# Patient Record
Sex: Female | Born: 1960 | Race: White | Hispanic: No | Marital: Single | Smoking: Current every day smoker
Health system: Southern US, Community
[De-identification: ages and names within clinical notes are randomized; demographics above are authoritative.]

## PROBLEM LIST (undated history)

## (undated) DIAGNOSIS — I639 Cerebral infarction, unspecified: Secondary | ICD-10-CM

## (undated) DIAGNOSIS — J45909 Unspecified asthma, uncomplicated: Secondary | ICD-10-CM

## (undated) DIAGNOSIS — M779 Enthesopathy, unspecified: Secondary | ICD-10-CM

## (undated) DIAGNOSIS — F1490 Cocaine use, unspecified, uncomplicated: Secondary | ICD-10-CM

## (undated) DIAGNOSIS — A4902 Methicillin resistant Staphylococcus aureus infection, unspecified site: Secondary | ICD-10-CM

## (undated) DIAGNOSIS — I10 Essential (primary) hypertension: Secondary | ICD-10-CM

## (undated) DIAGNOSIS — J189 Pneumonia, unspecified organism: Secondary | ICD-10-CM

## (undated) DIAGNOSIS — F411 Generalized anxiety disorder: Secondary | ICD-10-CM

## (undated) DIAGNOSIS — F32A Depression, unspecified: Secondary | ICD-10-CM

## (undated) DIAGNOSIS — F419 Anxiety disorder, unspecified: Secondary | ICD-10-CM

## (undated) HISTORY — PX: ROTATOR CUFF REPAIR: SHX139

## (undated) HISTORY — PX: TUBAL LIGATION: SHX77

---

## 2005-11-18 ENCOUNTER — Ambulatory Visit: Payer: Self-pay | Admitting: Unknown Physician Specialty

## 2007-06-20 ENCOUNTER — Ambulatory Visit: Payer: Self-pay | Admitting: Family Medicine

## 2008-07-23 ENCOUNTER — Ambulatory Visit (HOSPITAL_BASED_OUTPATIENT_CLINIC_OR_DEPARTMENT_OTHER): Admission: RE | Admit: 2008-07-23 | Discharge: 2008-07-23 | Payer: Self-pay | Admitting: Orthopedic Surgery

## 2009-05-21 ENCOUNTER — Emergency Department: Payer: Self-pay | Admitting: Emergency Medicine

## 2010-04-25 ENCOUNTER — Encounter: Payer: Self-pay | Admitting: Physical Medicine and Rehabilitation

## 2010-07-14 LAB — POCT HEMOGLOBIN-HEMACUE: Hemoglobin: 14 g/dL (ref 12.0–15.0)

## 2010-08-17 NOTE — Op Note (Signed)
NAMEGLADIE, GRAVETTE              ACCOUNT NO.:  [id number]   MEDICAL RECORD NO.:  [id number]          PATIENT TYPE:  AMB   LOCATION:  DSC                          FACILITY:  [facility]   PHYSICIAN:  Harvie Junior, M.D.   DATE OF BIRTH:  Sep 16, 1960   DATE OF PROCEDURE:  07/23/2008  DATE OF DISCHARGE:                               OPERATIVE REPORT   PREOPERATIVE DIAGNOSES:  1. Impingement.  2. Acromioclavicular joint arthritis.  3. Possible full thickness rotator cuff tear.   POSTOPERATIVE DIAGNOSES:  1. Impingement.  2. Acromioclavicular joint arthritis.  3. Partial thickness undersurface rotator cuff tear.   PROCEDURES:  1. Arthroscopic subacromial decompression over the lateral and      posterior compartment.  2. Distal clavicle resection to an anterior compartment.  3. Debridement of undersurface, partial thickness rotator cuff tear.   SURGEON:  Harvie Junior, MD   ASSISTANT:  Marshia Ly, PA   ANESTHESIA:  General.   BRIEF HISTORY:  Ms. Dombeck is a 50 year old female with long history of  having a significant left shoulder pain.  We treated her conservatively  for a period of time.  Because of continued complaints of pain, she is  also taken to operating room for operative arthroscopy.  She had been  improved significantly with preoperative injection therapy and she had  an MRI which showed that there was a questionable tear of her  supraspinatus at the leading edge.  Because of failure of all  conservative care, she is now taken to the operating room for this  procedure.   PROCEDURE IN DETAIL:  The patient was taken to operating room.  After  adequate anesthesia was obtained with general anesthetic, the patient  was placed supine on the operating table.  The left shoulder was prepped  and draped in the usual sterile fashion.  Following this, the routine  arthroscopic examination of the shoulder revealed there was an obvious  undersurface rotator cuff tear.  This  rotator cuff tear was debrided  with a suction shaver back to a stable rim.  There was some bleeding  from this undersurface.  We then took an 18-guage spinal and marked this  area from the undersurface with a PDS suture.  Once this mark was made,  we went out of glenohumeral joint and into the subacromial space, and  looked for this mark, found the marked area of the suture and with a  shaver really shaved quite aggressively over this area.  We took the  suture out and really could not find any full thickness area.  We used  the shaver and a probe with a feeler gauge and felt that there was  adequate thickness of rotator cuff at this point.  At this point, we  turned towards taking pressure off of the rotator cuff, so we did an  anterolateral acromioplasty from a lateral and posterior compartment.  We then did a distal clavicle resection of 18 mm to an anterior  compartment and used the ArthroCare wand to really cauterize the end of  the clavicle.  Once I was completed, we took  the shaver and did a  subtotal bursectomy of the subacromial space and then really took a good  long look at the cuff at this point, and really there was no evidence of  partial tearing from the top side.  At this point, the shoulder was  copiously and thoroughly irrigated and suctioned dry.  The left shoulder  portals were closed with the bandage.  Sterile compressive dressing was  applied, and the patient taken to recovery room, she was noted to be in  satisfactory condition.  Estimated blood loss during the procedure was  none.      Harvie Junior, M.D.  Electronically Signed     JLG/MEDQ  D:  07/23/2008  T:  07/24/2008  Job:  [id number]

## 2011-04-04 ENCOUNTER — Emergency Department: Payer: Self-pay | Admitting: Emergency Medicine

## 2011-04-05 LAB — DRUG SCREEN, URINE
Barbiturates, Ur Screen: NEGATIVE (ref ?–200)
Cannabinoid 50 Ng, Ur ~~LOC~~: POSITIVE (ref ?–50)
Cocaine Metabolite,Ur ~~LOC~~: NEGATIVE (ref ?–300)

## 2011-04-06 ENCOUNTER — Emergency Department: Payer: Self-pay | Admitting: Emergency Medicine

## 2011-04-06 LAB — COMPREHENSIVE METABOLIC PANEL
Albumin: 4 g/dL (ref 3.4–5.0)
Alkaline Phosphatase: 53 U/L (ref 50–136)
Bilirubin,Total: 0.9 mg/dL (ref 0.2–1.0)
Chloride: 102 mmol/L (ref 98–107)
Creatinine: 0.59 mg/dL — ABNORMAL LOW (ref 0.60–1.30)
EGFR (African American): >60
EGFR (Non-African Amer.): >60
SGOT(AST): 26 U/L (ref 15–37)
SGPT (ALT): 34 U/L
Total Protein: 7.6 g/dL (ref 6.4–8.2)

## 2011-04-06 LAB — URINALYSIS, COMPLETE
Blood: NEGATIVE
Ph: 6 (ref 4.5–8.0)
Protein: 100
RBC,UR: 1 /HPF (ref 0–5)

## 2011-04-06 LAB — CBC
HGB: 15.7 g/dL (ref 12.0–16.0)
MCH: 32.5 pg (ref 26.0–34.0)
MCHC: 35.2 g/dL (ref 32.0–36.0)
Platelet: 274 10*3/uL (ref 150–440)
RDW: 11.2 % — ABNORMAL LOW (ref 11.5–14.5)

## 2011-04-06 LAB — LIPASE, BLOOD: Lipase: 128 U/L (ref 73–393)

## 2011-04-08 ENCOUNTER — Inpatient Hospital Stay: Payer: Self-pay | Admitting: Internal Medicine

## 2011-04-08 LAB — COMPREHENSIVE METABOLIC PANEL
Alkaline Phosphatase: 55 U/L (ref 50–136)
Anion Gap: 11 (ref 7–16)
Calcium, Total: 9 mg/dL (ref 8.5–10.1)
Chloride: 98 mmol/L (ref 98–107)
Co2: 29 mmol/L (ref 21–32)
Creatinine: 0.77 mg/dL (ref 0.60–1.30)
EGFR (African American): >60
EGFR (Non-African Amer.): >60
Osmolality: 277 (ref 275–301)

## 2011-04-08 LAB — MAGNESIUM: Magnesium: 1.7 mg/dL — ABNORMAL LOW

## 2011-04-08 LAB — CBC
HCT: 51.1 % — ABNORMAL HIGH (ref 35.0–47.0)
HGB: 17.4 g/dL — ABNORMAL HIGH (ref 12.0–16.0)
MCHC: 34.1 g/dL (ref 32.0–36.0)
MCV: 93 fL (ref 80–100)

## 2011-04-08 LAB — TROPONIN I: Troponin-I: 0.27 ng/mL — ABNORMAL HIGH

## 2011-04-08 LAB — CK TOTAL AND CKMB (NOT AT ARMC)
CK, Total: 49 U/L
CK-MB: 2.8 ng/mL

## 2011-04-08 LAB — LIPASE, BLOOD: Lipase: 88 U/L (ref 73–393)

## 2011-04-09 LAB — URINALYSIS, COMPLETE
Bilirubin,UR: NEGATIVE
Glucose,UR: NEGATIVE mg/dL (ref 0–75)
Leukocyte Esterase: NEGATIVE
Nitrite: NEGATIVE
Protein: 30
RBC,UR: 7 /HPF (ref 0–5)
Specific Gravity: 1.02 (ref 1.003–1.030)
WBC UR: 9 /HPF (ref 0–5)

## 2011-04-09 LAB — CBC WITH DIFFERENTIAL/PLATELET
Basophil %: 0.2 %
Eosinophil #: 0.1 10*3/uL (ref 0.0–0.7)
Eosinophil %: 0.7 %
HCT: 43.7 % (ref 35.0–47.0)
HGB: 14.8 g/dL (ref 12.0–16.0)
MCH: 31.6 pg (ref 26.0–34.0)
MCV: 93 fL (ref 80–100)
Monocyte #: 1.1 10*3/uL — ABNORMAL HIGH (ref 0.0–0.7)
Neutrophil #: 9.6 10*3/uL — ABNORMAL HIGH (ref 1.4–6.5)
RBC: 4.69 10*6/uL (ref 3.80–5.20)
WBC: 13.9 10*3/uL — ABNORMAL HIGH (ref 3.6–11.0)

## 2011-04-09 LAB — CK TOTAL AND CKMB (NOT AT ARMC)
CK, Total: 40 U/L (ref 21–215)
CK, Total: 40 U/L (ref 21–215)
CK-MB: 2.3 ng/mL (ref 0.5–3.6)

## 2011-04-09 LAB — BASIC METABOLIC PANEL
Anion Gap: 9 (ref 7–16)
BUN: 12 mg/dL (ref 7–18)
Chloride: 103 mmol/L (ref 98–107)
Creatinine: 0.58 mg/dL — ABNORMAL LOW (ref 0.60–1.30)
Glucose: 108 mg/dL — ABNORMAL HIGH (ref 65–99)
Osmolality: 276 (ref 275–301)
Potassium: 3.6 mmol/L (ref 3.5–5.1)
Sodium: 138 mmol/L (ref 136–145)

## 2011-04-09 LAB — TROPONIN I
Troponin-I: 0.21 ng/mL — ABNORMAL HIGH
Troponin-I: 0.14 ng/mL — ABNORMAL HIGH

## 2011-04-09 LAB — TSH: Thyroid Stimulating Horm: 1.16 u[IU]/mL

## 2011-04-09 LAB — LIPID PANEL
Cholesterol: 169 mg/dL (ref 0–200)
Triglycerides: 118 mg/dL (ref 0–200)
VLDL Cholesterol, Calc: 24 mg/dL (ref 5–40)

## 2011-04-10 LAB — CBC WITH DIFFERENTIAL/PLATELET
Basophil #: 0 10*3/uL (ref 0.0–0.1)
Eosinophil #: 0.1 10*3/uL (ref 0.0–0.7)
Eosinophil %: 1.3 %
HCT: 35.2 % (ref 35.0–47.0)
Lymphocyte #: 3.3 10*3/uL (ref 1.0–3.6)
MCH: 31.8 pg (ref 26.0–34.0)
MCHC: 33.9 g/dL (ref 32.0–36.0)
MCV: 94 fL (ref 80–100)
Monocyte #: 0.8 10*3/uL — ABNORMAL HIGH (ref 0.0–0.7)
Platelet: 201 10*3/uL (ref 150–440)
RBC: 3.76 10*6/uL — ABNORMAL LOW (ref 3.80–5.20)
RDW: 13 % (ref 11.5–14.5)

## 2011-04-11 LAB — BASIC METABOLIC PANEL
Anion Gap: 9 (ref 7–16)
BUN: 8 mg/dL (ref 7–18)
Chloride: 108 mmol/L — ABNORMAL HIGH (ref 98–107)
Creatinine: 0.55 mg/dL — ABNORMAL LOW (ref 0.60–1.30)
EGFR (Non-African Amer.): >60
Glucose: 76 mg/dL (ref 65–99)
Osmolality: 282 (ref 275–301)
Potassium: 3.8 mmol/L (ref 3.5–5.1)

## 2012-04-23 ENCOUNTER — Emergency Department: Payer: Self-pay | Admitting: Unknown Physician Specialty

## 2012-12-13 ENCOUNTER — Emergency Department: Payer: Self-pay | Admitting: Emergency Medicine

## 2014-07-27 NOTE — Consult Note (Signed)
Brief Consult Note: Diagnosis: chest pain with atypical and typcial features.   Patient was seen by consultant.   Consult note dictated.   Recommend further assessment or treatment.   Orders entered.   Comments: 54 yo female who has been seen in the er several ties in the past week with compalints of chest pain. Pain is exertional but also occurs at rest or with deep breathing and deep palpation. Mild troponin elevation Chest ct unremarkable. Will need cardiac cath to evaluate anatomy to guiide further therapy in patient with chest pain, risk factors for cad and abnormal troponin. Left cardiac cath on Jan 7 with further recs after cath.  Electronic Signatures: Dalia HeadingFath, Yecenia Dalgleish A (MD)  (Signed 05-Jan-13 22:44)  Authored: Brief Consult Note   Last Updated: 05-Jan-13 22:44 by Dalia HeadingFath, Augustine Brannick A (MD)

## 2014-07-27 NOTE — Discharge Summary (Signed)
PATIENT NAME:  Molly Greer, Molly Greer MR#:  [id number] DATE OF BIRTH:  September 04, 1960  DATE OF ADMISSION:  04/08/2011 DATE OF DISCHARGE:  04/12/2011  ADMITTING PHYSICIAN: Dr. Camillo Flaming  DISCHARGING PHYSICIAN: Dr. Enid Baas  PRIMARY CARE PHYSICIAN: Dr. Esmond Plants IN THE HOSPITAL: Cardiology consultation by Dr. Harold Hedge.   DISCHARGE DIAGNOSES:  1. Chest pain secondary to costochondritis.  2. Elevated troponin, probably mild myocarditis on presentation but cardiac catheterization negative for any coronary artery disease.  3. Anxiety.  4. Inflammatory right inguinal and right pubic nodes with flares.  5. Tobacco use disorder.   DISCHARGE HOME MEDICATIONS:  1. Celexa 20 mg p.o. daily.  2. Xanax 0.5 p.o. b.i.d. p.r.n. This is patient's prior to admission medication.  3. Motrin 200 to 400 mg as needed every eight hours for pain.   DISCHARGE DIET: Regular diet.   DISCHARGE ACTIVITY: As tolerated.    FOLLOW UP INSTRUCTIONS:  1. Follow up with PCP in one week.  2. Follow up with surgery as needed if inguinal lymphadenopathy flares up and to evaluate need for biopsy.   LABORATORY, DIAGNOSTIC AND RADIOLOGICAL DATA: Labs at the time of discharge: Sodium 142, potassium 3.8, chloride 108, bicarbonate 26, BUN 8, creatinine 0.55, glucose 76, calcium 7.8.   WBC 9.3, hemoglobin 11.9, hematocrit 35.2, platelet count 201.   Echo Doppler showing mildly reduced left ventricular systolic function, ejection fraction of 45% to 50%. Left atrial size is normal. No pericardial effusion was noted.   Troponin was elevated at 0.27 on admission and came down to 0.14. Urinalysis negative for any infection but did have protein of almost 100 mg/dL.   ALT 34, AST 26, alkaline phosphatase 53, total bilirubin 0.9, albumin 4.0, lipase 128.   Chest x-ray showing blunting of right costophrenic angle secondary to pleural fibrosis rather than pleural effusion and possibly has underlying chronic  obstructive pulmonary disease.   LDL cholesterol 104, HDL 41, total cholesterol 045, triglycerides 118.   CT of the chest with contrast showing no PE. No thoracic aortic aneurysm. Mild ground-glass attenuation right lower lobe. Follow up in three months recommended. Areas of linear subsegmental atelectasis versus fibrosis is present.   CT of the abdomen and pelvis showing no acute changes in abdomen and pelvis. Appendix is not definitely identified. No inflammatory changes see in right lower quadrant. Small indeterminate lesion in left hepatic lobe is present.   BRIEF HOSPITAL COURSE: [name] is a 54 year old Caucasian female with past medical history significant for anxiety and tobacco use disorder who has been to the Emergency Room already twice prior to admission secondary to nausea, vomiting, chest and abdominal pain. She had a CT of the abdomen with results as above without any significant change so was discharged home, comes back with chest pain, similar symptoms with a slightly elevated troponin of 0.27.  1. Chest pain. Initially thought to be NSTEMI with slightly elevated troponin versus myocarditis because of the atypical nature of the pain with tenderness present. Evaluated by Dr. Lady Gary who felt to rule out coronary artery disease by catheterization so she had a cardiac catheterization done on 04/11/2011 and the results of the catheterization show that she did not have any significant atherosclerotic coronary artery disease. Her pain was being relieved with Percocet and Motrin while in the hospital and she is being discharged on Motrin.  2. Generalized anxiety with underlying depression. Patient has a lot of emotional issues at home because of her daughter. She has been tearful at times  while discussing the above matter. She was using Xanax lately at home p.r.n. for anxiety. Also started on low-dose Celexa while in the hospital.  3. Right inguinal lymph node. She does have bilateral inguinal  lymph nodes and also a small soft tissue mass felt in the right labia majora on day two of hospitalization but that disappeared. These nodes were tender and then they disappeared the next day. Patient says she has been has been noticing these nodes with flares and disappearing over the last three months so advised patient to see a surgeon whenever they flare up for possible biopsy if she is really concerned about it.   4. Her course has been otherwise uneventful.   DISCHARGE CONDITION: Stable.   DISCHARGE DISPOSITION: Home.   TIME SPENT ON DISCHARGE: 45 minutes.   ____________________________ Enid Baasadhika Harlowe Dowler, MD rk:cms D: 04/12/2011 17:03:29 ET T: 04/14/2011 10:09:19 ET JOB#: [id number]  cc: Enid Baasadhika Pamla Pangle, MD, <Dictator> Meindert A. Lacie ScottsNiemeyer, MD Darlin PriestlyKenneth A. Lady GaryFath, MD Adah Salvageichard E. Excell Seltzerooper, MD Enid BaasADHIKA Briyan Kleven MD ELECTRONICALLY SIGNED 04/18/2011 11:57

## 2014-07-27 NOTE — H&P (Signed)
PATIENT NAME:  Molly Greer, Kayley MR#:  [id number] DATE OF BIRTH:  [birth year]  DATE OF ADMISSION:  04/08/2011  REFERRING PHYSICIAN: Dr. Enedina FinnerGoli  PRIMARY CARE PHYSICIAN: Dr. Lacie ScottsNiemeyer   CHIEF COMPLAINT: "I'm having spasms in my chest."   HISTORY OF PRESENT ILLNESS: Patient is a 54 year old female with past medical history of anxiety and tobacco abuse who presents to the Emergency Department with the above-mentioned chief complaint. Patient states she came to the ER twice this week. She came Monday as she had nausea and several episodes of vomiting associated with some abdominal discomfort. Some labs were obtained in the ER and she underwent a CT of the abdomen which was unremarkable and she was given a bag of IV fluids and sent home on analgesics and antiemetics. This was Monday. Tuesday she stated that she laid in bed all day. She returned to the ER on Wednesday with persistent nausea and vomiting and she states she also had some chest discomfort at that time. She states that she was seen and evaluated and was given medications for gastroesophageal reflux disease and nausea as well as for pain and was once again sent home. She has not felt any better. Her nausea medications have almost ran out. She returned today with severe chest pain 10/10 described it as a stabbing sensation in the center of her chest which is radiating to her left arm, not improved with applying a heating pad and associated with mild shortness of breath. Chest pain radiated to the left arm. She did not have any nausea or vomiting today. She did not have any diaphoresis. Her chest pain lasted for about 15 minutes and when she notified her fiance she tried to relax and laid in the fetal position and her chest pain is somewhat improved. Thereafter, fiance brought her to the hospital to the ER and she redeveloped chest pain and spasming in her chest. EKG was obtained revealing sinus rhythm with left anterior hemiblock and nonspecific ST and T wave  changes in addition to QT prolongation. Troponin was checked and found to be elevated at 0.27. There are no prior EKGs for comparison in our system and troponin was not checked earlier in the week. She was also noted to have slightly elevated white count and also elevated hemoglobin and hematocrit and she states that she has had decreased p.o. intake over the past five days. She also had a urinalysis earlier in the week which was noted to have some ketones. Otherwise, at this time she is without specific complaints. Her chest pain has improved from 10/10 to 8/10 after she was given a dose of aspirin and metoprolol and Ativan in the ER. Otherwise, she is without specific complaints at this time. Hospitalist services were contacted for further evaluation.   PAST SURGICAL HISTORY: Left rotator cuff surgery.   PAST MEDICAL HISTORY:  1. Anxiety.  2. Tobacco abuse.   ALLERGIES: Penicillin.   HOME MEDICATIONS:  1. Xanax 0.5 mg p.o. t.i.d.-q.i.d. p.r.n.  2. Vicodin p.r.n. over the past week, she was given a prescription in our ER.  3. Antiemetic, unknown name which has ran out.   FAMILY HISTORY: Significant for coronary artery disease and myocardial infarction. Mother died at age 53 this past May secondary to myocardial infarction. Father died at age 53 secondary to myocardial infarction. She had a grandfather who died of a massive heart attack as well, an uncle and aunt with MI. Her grandmother had breast cancer.   SOCIAL HISTORY: Tobacco-States she has not  smoked over the past five days but normally smokes less than half pack per day, has been smoking on and off for approximately 30 years. Alcohol-Occasional. Illicit drugs-None. Patient comes accompanied by her fiancee today.   REVIEW OF SYSTEMS: CONSTITUTIONAL: Reports chest pain, shortness of breath and generalized weakness. Denies fevers, chills, or recent changes in weight. HEAD/EYES: Denies headache or blurry vision. ENT: Denies tinnitus, earache,  nasal discharge, or sore throat. RESPIRATORY: Has some shortness of breath. Denies cough or wheeziness. CARDIOVASCULAR: Reports chest pain and heart palpitations. Denies orthopnea or lower extremity edema. GASTROINTESTINAL: Had nausea and vomiting earlier in the week, none today. Denies abdominal pain, melena, hematochezia or diarrhea or constipation. GENITOURINARY: She denies dysuria or hematuria. ENDOCRINE: She denies heat or cold intolerance or history of thyroid abnormalities. HEME/LYMPH: Denies easy bruising or bleeding. INTEGUMENTARY: Denies rash. MUSCULOSKELETAL: Has pain in her chest. Denies back pain. Has generalized muscle weakness. NEUROLOGIC: Has generalized weakness. Denies headache. She had mild numbness and tingling of the left arm associated with her chest pain. Denies dysarthria. PSYCHIATRIC: Has underlying anxiety. Denies depression but has been in bereavement and grieving process secondary to the recent death of her mother. She denies suicidal or homicidal ideation.    PHYSICAL EXAMINATION:  VITAL SIGNS: Temperature 98.6, pulse 87, blood pressure 132/86, respirations 20, oxygen saturation 100% on room air.   GENERAL: Moderately anxious-appearing female. She is alert and oriented, in mild distress secondary to pain.   HEENT: Normocephalic, atraumatic. Pupils are equal, round and reactive to light accommodation. Extraocular muscles are intact. Anicteric sclerae. Conjunctivae pink. Hearing intact to voice. Nares without drainage. Oral mucous is dry but without any lesions noted.   NECK: Supple with full range of motion. No JVD, lymphadenopathy, or carotid bruits bilaterally. No thyromegaly or tenderness to palpation over thyroid gland.    CHEST: Normal respiratory effort without use of accessory respiratory muscles. Lungs are clear to auscultation bilaterally without crackles or rales or wheezing.   CARDIOVASCULAR: S1, S2 positive, regular rate and rhythm. No murmurs, rubs, or gallops.  PMI is non-lateralized.   ABDOMEN: Soft, nontender, nondistended. Normoactive bowel sounds. No hepatosplenomegaly or palpable masses. No hernias.   EXTREMITIES: No clubbing, cyanosis, or edema. Pedal pulses are palpable bilaterally.   SKIN: No suspicious rashes.   LYMPH: No cervical lymphadenopathy.   NEUROLOGICAL: Alert and oriented x3. Cranial nerves II through XII are grossly intact. No focal deficits.   PSYCH: Anxious-appearing female.   LABORATORY, DIAGNOSTIC AND RADIOLOGICAL DATA:  Chest x-ray PA and lateral: There is blunting of the right costosternal angle. No definite effusion is seen. Correlate for pleural fibrosis rather than pleural effusion as the posterior gutter appears unremarkable. Lungs show normal aeration otherwise. Cardiac silhouette is normal. No underlying mass, infiltrate or pneumothorax is noted. There is probable chronic obstructive pulmonary disease.   CBC: WBC 13.2, hemoglobin 17.4, hematocrit 51.1, platelets 299, MCV 93.   Complete metabolic panel within normal limits except for serum potassium of 3.3.   Lipase 88.   Troponin 0.27.   EKG: Normal sinus rhythm, 90 beats per minute with left anterior hemiblock. There is RSR pattern in V1 suggestive of possible right ventricular conduction delay. There is nonspecific ST and T wave changes. There is QT prolongation at 516 ms. No prior EKGs for comparison in our system.   ASSESSMENT AND PLAN: 54 year old female with past medical history of anxiety and tobacco abuse as well as strong family history of coronary artery disease and myocardial infarction who presents to  the Emergency Department with complaints of chest pain and shortness of breath, found to have abnormal EKG with elevated troponin with:  1. Non-STEMI. Will admit patient to telemetry unit. Continue to cycle her cardiac enzymes. She may have been experiencing an myocardial infarction earlier in the week as she had nausea and vomiting as well as abdominal  pain but no EKG or cardiac enzymes were checked earlier in the week. Her troponin is now elevated and could have trended down from earlier this week. Will continue to cycle her cardiac enzymes. For now will keep her on oxygen, aspirin, Lovenox, nitrate and beta blocker therapy (low dose) and also provide pain control until she is chest pain free. Will obtain 2-D echocardiogram and cardiology consultation for further recommendations. Further work-up and management to follow depending on patient's clinical course. Patient also noted to have abnormal EKG with left anterior hemiblock and nonspecific ST and T wave changes as well as prolonged QT for which we will also check a magnesium level. There is also suggestion of possible right ventricular conduction delay which could be secondary to pulmonary issue such as chronic obstructive pulmonary disease and she may need further workup and evaluation which can likely be performed as an outpatient but will defer this to the primary team.  2. Leukocytosis/erythrocytosis. Suspect this could be due to hemoconcentration. Patient has not eating adequately for two days secondary to nausea and vomiting and feeling generally ill. She also had ketones in the urine further suggestive of hemoconcentration and possible volume depletion. Will see if her WBC and hemoglobin and hematocrit improve with hydration and if not will pursue further work-up. Additionally, she may have erythrocytosis from chronic obstructive pulmonary disease and smoking history and chest x-ray was also suggestive of possible chronic obstructive pulmonary disease and she may need referral to pulmonology as an outpatient but will defer this to primary team. Otherwise, her WBC count could also be stress induced and she is currently afebrile therefore, will hold off on antibiotic therapy at this time and will follow up CBC post hydration. 3. Hypokalemia likely due to recurrent vomiting. Will replace with potassium  chloride and recheck level in a.m.  4. Anxiety. Continue p.r.n. Xanax.  5. Tobacco abuse. Patient was strongly counseled on the importance of smoking cessation, approximately three minutes were spent in doing so. Patient declined nicotine patch at this time.  6. Deep vein thrombosis prophylaxis with Lovenox.  7. CODE STATUS: FULL CODE.   TIME SPENT ON THIS ADMISSION: Approximately 50 minutes.   ____________________________ Elon Alas, MD knl:cms D: 04/08/2011 22:39:00 ET T: 04/09/2011 08:13:12 ET JOB#: [id number]  cc: Elon Alas, MD, <Dictator> Meindert A. Lacie Scotts, MD Elon Alas MD ELECTRONICALLY SIGNED 04/21/2011 18:56

## 2014-08-28 ENCOUNTER — Encounter: Payer: Self-pay | Admitting: Podiatry

## 2014-08-28 ENCOUNTER — Ambulatory Visit (INDEPENDENT_AMBULATORY_CARE_PROVIDER_SITE_OTHER): Payer: Self-pay | Admitting: Podiatry

## 2014-08-28 ENCOUNTER — Ambulatory Visit (INDEPENDENT_AMBULATORY_CARE_PROVIDER_SITE_OTHER): Payer: Self-pay

## 2014-08-28 VITALS — BP 126/83 | HR 91 | Resp 16 | Ht 62.0 in | Wt 120.0 lb

## 2014-08-28 DIAGNOSIS — M722 Plantar fascial fibromatosis: Secondary | ICD-10-CM

## 2014-08-28 MED ORDER — TRIAMCINOLONE ACETONIDE 10 MG/ML IJ SUSP
10.0000 mg | Freq: Once | INTRAMUSCULAR | Status: AC
  Administered 2014-08-28: 10 mg

## 2014-08-28 NOTE — Patient Instructions (Signed)
Plantar Fasciitis (Heel Spur Syndrome) with Rehab The plantar fascia is a fibrous, ligament-like, soft-tissue structure that spans the bottom of the foot. Plantar fasciitis is a condition that causes pain in the foot due to inflammation of the tissue. SYMPTOMS   Pain and tenderness on the underneath side of the foot.  Pain that worsens with standing or walking. CAUSES  Plantar fasciitis is caused by irritation and injury to the plantar fascia on the underneath side of the foot. Common mechanisms of injury include:  Direct trauma to bottom of the foot.  Damage to a small nerve that runs under the foot where the main fascia attaches to the heel bone.  Stress placed on the plantar fascia due to bone spurs. RISK INCREASES WITH:   Activities that place stress on the plantar fascia (running, jumping, pivoting, or cutting).  Poor strength and flexibility.  Improperly fitted shoes.  Tight calf muscles.  Flat feet.  Failure to warm-up properly before activity.  Obesity. PREVENTION  Warm up and stretch properly before activity.  Allow for adequate recovery between workouts.  Maintain physical fitness:  Strength, flexibility, and endurance.  Cardiovascular fitness.  Maintain a health body weight.  Avoid stress on the plantar fascia.  Wear properly fitted shoes, including arch supports for individuals who have flat feet. PROGNOSIS  If treated properly, then the symptoms of plantar fasciitis usually resolve without surgery. However, occasionally surgery is necessary. RELATED COMPLICATIONS   Recurrent symptoms that may result in a chronic condition.  Problems of the lower back that are caused by compensating for the injury, such as limping.  Pain or weakness of the foot during push-off following surgery.  Chronic inflammation, scarring, and partial or complete fascia tear, occurring more often from repeated injections. TREATMENT  Treatment initially involves the use of  ice and medication to help reduce pain and inflammation. The use of strengthening and stretching exercises may help reduce pain with activity, especially stretches of the Achilles tendon. These exercises may be performed at home or with a therapist. Your caregiver may recommend that you use heel cups of arch supports to help reduce stress on the plantar fascia. Occasionally, corticosteroid injections are given to reduce inflammation. If symptoms persist for greater than 6 months despite non-surgical (conservative), then surgery may be recommended.  MEDICATION   If pain medication is necessary, then nonsteroidal anti-inflammatory medications, such as aspirin and ibuprofen, or other minor pain relievers, such as acetaminophen, are often recommended.  Do not take pain medication within 7 days before surgery.  Prescription pain relievers may be given if deemed necessary by your caregiver. Use only as directed and only as much as you need.  Corticosteroid injections may be given by your caregiver. These injections should be reserved for the most serious cases, because they may only be given a certain number of times. HEAT AND COLD  Cold treatment (icing) relieves pain and reduces inflammation. Cold treatment should be applied for 10 to 15 minutes every 2 to 3 hours for inflammation and pain and immediately after any activity that aggravates your symptoms. Use ice packs or massage the area with a piece of ice (ice massage).  Heat treatment may be used prior to performing the stretching and strengthening activities prescribed by your caregiver, physical therapist, or athletic trainer. Use a heat pack or soak the injury in warm water. SEEK IMMEDIATE MEDICAL CARE IF:  Treatment seems to offer no benefit, or the condition worsens.  Any medications produce adverse side effects. EXERCISES RANGE   OF MOTION (ROM) AND STRETCHING EXERCISES - Plantar Fasciitis (Heel Spur Syndrome) These exercises may help you  when beginning to rehabilitate your injury. Your symptoms may resolve with or without further involvement from your physician, physical therapist or athletic trainer. While completing these exercises, remember:   Restoring tissue flexibility helps normal motion to return to the joints. This allows healthier, less painful movement and activity.  An effective stretch should be held for at least 30 seconds.  A stretch should never be painful. You should only feel a gentle lengthening or release in the stretched tissue. RANGE OF MOTION - Toe Extension, Flexion  Sit with your right / left leg crossed over your opposite knee.  Grasp your toes and gently pull them back toward the top of your foot. You should feel a stretch on the bottom of your toes and/or foot.  Hold this stretch for __________ seconds.  Now, gently pull your toes toward the bottom of your foot. You should feel a stretch on the top of your toes and or foot.  Hold this stretch for __________ seconds. Repeat __________ times. Complete this stretch __________ times per day.  RANGE OF MOTION - Ankle Dorsiflexion, Active Assisted  Remove shoes and sit on a chair that is preferably not on a carpeted surface.  Place right / left foot under knee. Extend your opposite leg for support.  Keeping your heel down, slide your right / left foot back toward the chair until you feel a stretch at your ankle or calf. If you do not feel a stretch, slide your bottom forward to the edge of the chair, while still keeping your heel down.  Hold this stretch for __________ seconds. Repeat __________ times. Complete this stretch __________ times per day.  STRETCH - Gastroc, Standing  Place hands on wall.  Extend right / left leg, keeping the front knee somewhat bent.  Slightly point your toes inward on your back foot.  Keeping your right / left heel on the floor and your knee straight, shift your weight toward the wall, not allowing your back to  arch.  You should feel a gentle stretch in the right / left calf. Hold this position for __________ seconds. Repeat __________ times. Complete this stretch __________ times per day. STRETCH - Soleus, Standing  Place hands on wall.  Extend right / left leg, keeping the other knee somewhat bent.  Slightly point your toes inward on your back foot.  Keep your right / left heel on the floor, bend your back knee, and slightly shift your weight over the back leg so that you feel a gentle stretch deep in your back calf.  Hold this position for __________ seconds. Repeat __________ times. Complete this stretch __________ times per day. STRETCH - Gastrocsoleus, Standing  Note: This exercise can place a lot of stress on your foot and ankle. Please complete this exercise only if specifically instructed by your caregiver.   Place the ball of your right / left foot on a step, keeping your other foot firmly on the same step.  Hold on to the wall or a rail for balance.  Slowly lift your other foot, allowing your body weight to press your heel down over the edge of the step.  You should feel a stretch in your right / left calf.  Hold this position for __________ seconds.  Repeat this exercise with a slight bend in your right / left knee. Repeat __________ times. Complete this stretch __________ times per day.    STRENGTHENING EXERCISES - Plantar Fasciitis (Heel Spur Syndrome)  These exercises may help you when beginning to rehabilitate your injury. They may resolve your symptoms with or without further involvement from your physician, physical therapist or athletic trainer. While completing these exercises, remember:   Muscles can gain both the endurance and the strength needed for everyday activities through controlled exercises.  Complete these exercises as instructed by your physician, physical therapist or athletic trainer. Progress the resistance and repetitions only as guided. STRENGTH -  Towel Curls  Sit in a chair positioned on a non-carpeted surface.  Place your foot on a towel, keeping your heel on the floor.  Pull the towel toward your heel by only curling your toes. Keep your heel on the floor.  If instructed by your physician, physical therapist or athletic trainer, add ____________________ at the end of the towel. Repeat __________ times. Complete this exercise __________ times per day. STRENGTH - Ankle Inversion  Secure one end of a rubber exercise band/tubing to a fixed object (table, pole). Loop the other end around your foot just before your toes.  Place your fists between your knees. This will focus your strengthening at your ankle.  Slowly, pull your big toe up and in, making sure the band/tubing is positioned to resist the entire motion.  Hold this position for __________ seconds.  Have your muscles resist the band/tubing as it slowly pulls your foot back to the starting position. Repeat __________ times. Complete this exercises __________ times per day.  Document Released: 03/21/2005 Document Revised: 06/13/2011 Document Reviewed: 07/03/2008 [employer] Patient Information 2015 [employer]. This information is not intended to replace advice given to you by your health care provider. Make sure you discuss any questions you have with your health care provider.  

## 2014-09-01 NOTE — Progress Notes (Signed)
Subjective:     Patient ID: Molly Greer, female   DOB: [birth year], 54 y.o.   MRN: [id number]  HPI 54 year old female presents the office they with complaints of right heel pain which been ongoing for proximally 2 months. She states that she has pain mostly in the morning or after periods of inactivity which is relieved by ambulation. She states that she is on her feet quite some time as she works double shifts. She's been applying ice to the area which she was to help some. She also states that she possibly has early rheumatoid arthritis however she has not been officially diagnosed yet. She denies any numbness or tingling. Denies any swelling or redness. Denies any history of injury or trauma or any change or increase in activity the time of onset of symptoms. No other complaints at this time.  Review of Systems  All other systems reviewed and are negative.      Objective:   Physical Exam AAO x3, NAD DP/PT pulses palpable bilaterally, CRT less than 3 seconds Protective sensation intact with Simms Weinstein monofilament, vibratory sensation intact, Achilles tendon reflex intact Tenderness to palpation overlying the plantar medial tubercle of the calcaneus to the right heel at the insertion of the plantar fascia. There is no pain along the course of plantar fascia within the arch of the foot. The plantar fascia appears intact. There is no pain with lateral compression of the calcaneus or pain the vibratory sensation. No pain on the posterior aspect of the calcaneus or along the course/insertion of the Achilles tendon. There is no overlying edema, erythema, increase in warmth. No other areas of tenderness palpation or pain with vibratory sensation to the foot/ankle. MMT 5/5, ROM WNL No open lesions or pre-ulcerative lesions are identified. No pain with calf compression, swelling, warmth, erythema.     Assessment:     54 year old female right heel pain, likely plantar fasciitis    Plan:      -X-rays were obtained and reviewed with the patient.  -Treatment options discussed including all alternatives, risks, and complications Patient elects to proceed with steroid injection into the right heel. Under sterile skin preparation, a total of 2.5cc of kenalog 10, 0.5% Marcaine plain, and 2% lidocaine plain were infiltrated into the symptomatic area without complication. A band-aid was applied. Patient tolerated the injection well without complication. Post-injection care with discussed with the patient. Discussed with the patient to ice the area over the next couple of days to help prevent a steroid flare.  -Plantar fascial taping was applied. -Ice to the area daily -Discussed stretching exercises. -She started taking ibuprofen 800 mg can continue this. -Discussed shoe gear modifications and possible orthotics. Recommended to wear supportive shoe all the time not to go barefoot even while at home. -Follow-up in 3 weeks or sooner if any problems are to arise. In the meantime, call the office with any questions, concerns, change in symptoms.

## 2014-09-18 ENCOUNTER — Ambulatory Visit: Payer: Self-pay | Admitting: Podiatry

## 2015-10-25 ENCOUNTER — Emergency Department
Admission: EM | Admit: 2015-10-25 | Discharge: 2015-10-25 | Disposition: A | Discharge status: Home / Self Care | Payer: Self-pay | Attending: Emergency Medicine | Admitting: Emergency Medicine

## 2015-10-25 ENCOUNTER — Encounter: Payer: Self-pay | Admitting: Emergency Medicine

## 2015-10-25 DIAGNOSIS — F17210 Nicotine dependence, cigarettes, uncomplicated: Secondary | ICD-10-CM | POA: Insufficient documentation

## 2015-10-25 DIAGNOSIS — B029 Zoster without complications: Secondary | ICD-10-CM | POA: Insufficient documentation

## 2015-10-25 HISTORY — DX: Anxiety disorder, unspecified: F41.9

## 2015-10-25 HISTORY — DX: Enthesopathy, unspecified: M77.9

## 2015-10-25 LAB — CBC WITH DIFFERENTIAL/PLATELET
BASOS PCT: 1 %
Basophils Absolute: 0.1 10*3/uL (ref 0–0.1)
EOS ABS: 0.2 10*3/uL (ref 0–0.7)
Eosinophils Relative: 3 %
HCT: 39.5 % (ref 35.0–47.0)
Hemoglobin: 14 g/dL (ref 12.0–16.0)
Lymphocytes Relative: 32 %
Lymphs Abs: 2.2 10*3/uL (ref 1.0–3.6)
MCH: 32.3 pg (ref 26.0–34.0)
MCHC: 35.4 g/dL (ref 32.0–36.0)
MCV: 91.3 fL (ref 80.0–100.0)
MONO ABS: 0.6 10*3/uL (ref 0.2–0.9)
MONOS PCT: 9 %
NEUTROS PCT: 55 %
Neutro Abs: 3.9 10*3/uL (ref 1.4–6.5)
Platelets: 217 10*3/uL (ref 150–440)
RBC: 4.33 MIL/uL (ref 3.80–5.20)
RDW: 13.2 % (ref 11.5–14.5)
WBC: 6.9 10*3/uL (ref 3.6–11.0)

## 2015-10-25 LAB — BASIC METABOLIC PANEL
Anion gap: 5 (ref 5–15)
BUN: 18 mg/dL (ref 6–20)
CALCIUM: 8.9 mg/dL (ref 8.9–10.3)
CO2: 25 mmol/L (ref 22–32)
Chloride: 110 mmol/L (ref 101–111)
Creatinine, Ser: 0.56 mg/dL (ref 0.44–1.00)
GFR calc Af Amer: >60 mL/min (ref >60)
GLUCOSE: 91 mg/dL (ref 65–99)
Potassium: 3.8 mmol/L (ref 3.5–5.1)
Sodium: 140 mmol/L (ref 135–145)

## 2015-10-25 MED ORDER — ACYCLOVIR 400 MG PO TABS: 400.0000 mg | ORAL_TABLET | Freq: Every day | ORAL | 0 refills | Status: AC

## 2015-10-25 MED ORDER — OXYCODONE-ACETAMINOPHEN 5-325 MG PO TABS: 1.0000 | ORAL_TABLET | ORAL | 0 refills | Status: DC | PRN

## 2015-10-25 MED ORDER — GABAPENTIN 100 MG PO CAPS: 100.0000 mg | ORAL_CAPSULE | Freq: Three times a day (TID) | ORAL | 0 refills | Status: DC

## 2015-10-25 MED ORDER — BACITRACIN-NEOMYCIN-POLYMYXIN OINTMENT TUBE
TOPICAL_OINTMENT | Freq: Once | CUTANEOUS | Status: AC
  Administered 2015-10-25: 1 via TOPICAL

## 2015-10-25 MED ORDER — KETOROLAC TROMETHAMINE 60 MG/2ML IM SOLN
60.0000 mg | Freq: Once | INTRAMUSCULAR | Status: AC
  Administered 2015-10-25: 60 mg via INTRAMUSCULAR
  Filled 2015-10-25: qty 2

## 2015-10-25 NOTE — ED Triage Notes (Signed)
Pt states she noticed rash to mid back on both sides of back.  Pt also states she has pain to her left side extending up her back.  Pt has been using bactroban and bandaids to back to cover rash. Pt states her daughter told her it was Shingles.

## 2015-10-25 NOTE — ED Notes (Signed)
Used non-stick dressing to apply to middle back wounds and secured with tape.  Neosporin applied prior to dressings applied.

## 2015-10-25 NOTE — ED Triage Notes (Signed)
Pt states she took 800mg  of Ibuprofen around 1000 today

## 2015-10-25 NOTE — ED Notes (Signed)

## 2015-10-25 NOTE — ED Provider Notes (Signed)
[facility] Emergency Department Provider Note   ____________________________________________  Time seen: Approximately 11:49 AM  I have reviewed the triage vital signs and the nursing notes.   HISTORY  Chief Complaint Herpes Zoster    HPI Molly Greer is a 55 y.o. female presents for evaluation of vesicular rash to her back times one week. Patient states that it started as a pustular lesion and has progressively worsened to the sides of the back. Patient denies any fever chills nausea vomiting but does feel rundown no energy. She describes the pain as radiating anteriorly.   Past Medical History:  Diagnosis Date  . Anxiety   . Bone spur    right foot    There are no active problems to display for this patient.   Past Surgical History:  Procedure Laterality Date  . ROTATOR CUFF REPAIR    . TUBAL LIGATION      Current Outpatient Rx  . Order #: [id number] Class: Print  . Order #: [id number] Class: Historical Med  . Order #: [id number] Class: Print  . Order #: [id number] Class: Historical Med  . Order #: [id number] Class: Print  . Order #: [id number] Class: Historical Med    Allergies Penicillins  History reviewed. No pertinent family history.  Social History Social History  Substance Use Topics  . Smoking status: Current Every Day Smoker    Packs/day: 0.50    Types: Cigarettes  . Smokeless tobacco: Never Used  . Alcohol use 0.0 oz/week    Review of Systems Constitutional: No fever/chills Eyes: No visual changes. ENT: No sore throat. Cardiovascular: Denies chest pain. Respiratory: Denies shortness of breath. Gastrointestinal: No abdominal pain.  No nausea, no vomiting.  No diarrhea.  No constipation. Genitourinary: Negative for dysuria. Musculoskeletal: Negative for back pain. Skin: Positive for rash/lesions to the back. Neurological: Negative for headaches, focal weakness or numbness.  10-point ROS otherwise  negative.  ____________________________________________   PHYSICAL EXAM:  VITAL SIGNS: ED Triage Vitals [10/25/15 1131]  Enc Vitals Group     BP (!) 162/101     Pulse Rate 72     Resp 18     Temp 98.3 F (36.8 C)     Temp Source Oral     SpO2 99 %     Weight 118 lb (53.5 kg)     Height  (1.575 m)     Head Circumference      Peak Flow      Pain Score      Pain Loc      Pain Edu?      Excl. in GC?     Constitutional: Alert and oriented. Well appearing and in no acute distress.  Cardiovascular: Normal rate, regular rhythm. Grossly normal heart sounds.  Good peripheral circulation. Respiratory: Normal respiratory effort.  No retractions. Lungs CTAB. Musculoskeletal: No lower extremity tenderness nor edema.  No joint effusions. Neurologic:  Normal speech and language. No gross focal neurologic deficits are appreciated. No gait instability. Skin:  Skin is warm, dry and intact. 2 vesicular area of lesions excoriated to either side of the thoracic spine approximately T5-T6. Psychiatric: Mood and affect are normal. Speech and behavior are normal.  ____________________________________________   LABS (all labs ordered are listed, but only abnormal results are displayed)  Labs Reviewed  BASIC METABOLIC PANEL  CBC WITH DIFFERENTIAL/PLATELET   ____________________________________________  EKG   ____________________________________________  RADIOLOGY   ____________________________________________   PROCEDURES  Procedure(s) performed: None  Critical Care performed: No  ____________________________________________   INITIAL IMPRESSION /  ASSESSMENT AND PLAN / ED COURSE  Pertinent labs & imaging results that were available during my care of the patient were reviewed by me and considered in my medical decision making (see chart for details).  Rashes consistent with shingles despite the fact that it's on either side of the spine. Rx given for acyclovir,  gabapentin and Percocet for severe pain. Patient follow-up with her PCP or return to ER with any worsening symptomology.  Clinical Course    ____________________________________________   FINAL CLINICAL IMPRESSION(S) / ED DIAGNOSES  Final diagnoses:  Shingles     This chart was dictated using voice recognition software/Dragon. Despite best efforts to proofread, errors can occur which can change the meaning. Any change was purely unintentional.    Evangeline Dakin, PA-C 10/25/15 1358    Governor Rooks, MD 10/25/15 [phone]

## 2016-05-13 ENCOUNTER — Emergency Department: Payer: Self-pay

## 2016-05-13 ENCOUNTER — Emergency Department
Admission: EM | Admit: 2016-05-13 | Discharge: 2016-05-13 | Disposition: A | Discharge status: Home / Self Care | Payer: Self-pay | Attending: Emergency Medicine | Admitting: Emergency Medicine

## 2016-05-13 DIAGNOSIS — F17210 Nicotine dependence, cigarettes, uncomplicated: Secondary | ICD-10-CM | POA: Insufficient documentation

## 2016-05-13 DIAGNOSIS — Z791 Long term (current) use of non-steroidal anti-inflammatories (NSAID): Secondary | ICD-10-CM | POA: Insufficient documentation

## 2016-05-13 DIAGNOSIS — J181 Lobar pneumonia, unspecified organism: Secondary | ICD-10-CM | POA: Insufficient documentation

## 2016-05-13 DIAGNOSIS — Z79899 Other long term (current) drug therapy: Secondary | ICD-10-CM | POA: Insufficient documentation

## 2016-05-13 DIAGNOSIS — J189 Pneumonia, unspecified organism: Secondary | ICD-10-CM

## 2016-05-13 LAB — TROPONIN I

## 2016-05-13 LAB — CBC
HCT: 41.3 % (ref 35.0–47.0)
HEMOGLOBIN: 14.2 g/dL (ref 12.0–16.0)
MCH: 31.2 pg (ref 26.0–34.0)
MCHC: 34.4 g/dL (ref 32.0–36.0)
MCV: 90.8 fL (ref 80.0–100.0)
Platelets: 181 10*3/uL (ref 150–440)
RBC: 4.55 MIL/uL (ref 3.80–5.20)
RDW: 12.3 % (ref 11.5–14.5)
WBC: 12.2 10*3/uL — ABNORMAL HIGH (ref 3.6–11.0)

## 2016-05-13 LAB — BASIC METABOLIC PANEL
ANION GAP: 9 (ref 5–15)
BUN: 14 mg/dL (ref 6–20)
CALCIUM: 9.1 mg/dL (ref 8.9–10.3)
CO2: 26 mmol/L (ref 22–32)
Chloride: 98 mmol/L — ABNORMAL LOW (ref 101–111)
Creatinine, Ser: 0.59 mg/dL (ref 0.44–1.00)
GFR calc Af Amer: >60 mL/min (ref >60)
GFR calc non Af Amer: >60 mL/min (ref >60)
GLUCOSE: 113 mg/dL — AB (ref 65–99)
Potassium: 3.8 mmol/L (ref 3.5–5.1)
Sodium: 133 mmol/L — ABNORMAL LOW (ref 135–145)

## 2016-05-13 MED ORDER — LEVOFLOXACIN IN D5W 750 MG/150ML IV SOLN
INTRAVENOUS | Status: AC
  Filled 2016-05-13: qty 150

## 2016-05-13 MED ORDER — ACETAMINOPHEN 325 MG PO TABS
650.0000 mg | ORAL_TABLET | Freq: Once | ORAL | Status: AC | PRN
  Administered 2016-05-13: 650 mg via ORAL
  Filled 2016-05-13: qty 2

## 2016-05-13 MED ORDER — LEVOFLOXACIN IN D5W 750 MG/150ML IV SOLN
750.0000 mg | INTRAVENOUS | Status: DC
  Administered 2016-05-13: 750 mg via INTRAVENOUS

## 2016-05-13 MED ORDER — SODIUM CHLORIDE 0.9 % IV BOLUS (SEPSIS)
500.0000 mL | Freq: Once | INTRAVENOUS | Status: AC
  Administered 2016-05-13: 500 mL via INTRAVENOUS

## 2016-05-13 MED ORDER — LEVOFLOXACIN 750 MG PO TABS: 750.0000 mg | ORAL_TABLET | Freq: Every day | ORAL | 0 refills | Status: DC

## 2016-05-13 NOTE — ED Provider Notes (Addendum)
Time Seen: Approximately *2016  I have reviewed the triage notes  Chief Complaint: Chest Pain   History of Present Illness: Molly Greer is a 56 y.o. female who presents with some upper respiratory symptoms that started over the last 5 days with an increasing cough and some shortness of breath. Patient arrives with a temperature of 102.9 and states her cough is periodically productive. Denies any nausea, vomiting or significant chest pain other than bilateral chest wall discomfort. Denies any arm or jaw pain. She denies any upper back pain. She denies any leg pain or swelling.   Past Medical History:  Diagnosis Date  . Anxiety   . Bone spur    right foot    There are no active problems to display for this patient.   Past Surgical History:  Procedure Laterality Date  . ROTATOR CUFF REPAIR    . TUBAL LIGATION      Past Surgical History:  Procedure Laterality Date  . ROTATOR CUFF REPAIR    . TUBAL LIGATION      Current Outpatient Rx  . Order #: [id number] Class: Historical Med  . Order #: [id number] Class: Print  . Order #: [id number] Class: Historical Med  . Order #: [id number] Class: Print  . Order #: [id number] Class: Print  . Order #: [id number] Class: Historical Med    Allergies:  Penicillins  Family History: No family history on file.  Social History: Social History  Substance Use Topics  . Smoking status: Current Every Day Smoker    Packs/day: 0.50    Types: Cigarettes  . Smokeless tobacco: Never Used  . Alcohol use 0.0 oz/week     Review of Systems:   10 point review of systems was performed and was otherwise negative:  ConstitutionalNo:Fever mentioned above Eyes: No visual disturbances ENT: No sore throat, ear pain Cardiac: Chest pain is somewhat lateral and she points to both the flank areas.  Respiratory: Mild shortness of breath with no wheezing or stridor Abdomen: No abdominal pain, no vomiting, No diarrhea Endocrine: No weight loss, No night  sweats Extremities: No peripheral edema, cyanosis Skin: No rashes, easy bruising Neurologic: No focal weakness, trouble with speech or swollowing Urologic: No dysuria, Hematuria, or urinary frequency   Physical Exam:  ED Triage Vitals  Enc Vitals Group     BP 05/13/16 1801 133/81     Pulse Rate 05/13/16 1801 (!) 114     Resp 05/13/16 1801 20     Temp 05/13/16 1801 (!) 102.9 F (39.4 C)     Temp Source 05/13/16 2009 Oral     SpO2 05/13/16 1801 94 %     Weight 05/13/16 1801 118 lb (53.5 kg)     Height 05/13/16 1801 5\' 2"  (1.575 m)     Head Circumference --      Peak Flow --      Pain Score 05/13/16 1803 10     Pain Loc --      Pain Edu? --      Excl. in GC? --     General: Awake , Alert , and Oriented times 3; GCS 15 Head: Normal cephalic , atraumatic Eyes: Pupils equal , round, reactive to light Nose/Throat: No nasal drainage, patent upper airway without erythema or exudate.  Neck: Supple, Full range of motion, No anterior adenopathy or palpable thyroid masses Lungs: Mild rhonchi heard at the right base without wheezing or rales Heart: Regular rate, regular rhythm without murmurs , gallops , or rubs Abdomen: Soft, non tender without rebound,  guarding , or rigidity; bowel sounds positive and symmetric in all 4 quadrants. No organomegaly .        Extremities: 2 plus symmetric pulses. No edema, clubbing or cyanosis Neurologic: normal ambulation, Motor symmetric without deficits, sensory intact Skin: warm, dry, no rashes   Labs:   All laboratory work was reviewed including any pertinent negatives or positives listed below:  Labs Reviewed  BASIC METABOLIC PANEL - Abnormal; Notable for the following:       Result Value   Sodium 133 (*)    Chloride 98 (*)    Glucose, Bld 113 (*)    All other components within normal limits  CBC - Abnormal; Notable for the following:    WBC 12.2 (*)    All other components within normal limits  TROPONIN I    EKG:  ED ECG REPORT I,  Jennye Moccasin, the attending physician, personally viewed and interpreted this ECG.  Date: 05/13/2016 EKG Time: 1759 Rate:102Rhythm: normal sinus rhythm QRS Axis: Mild left axis deviation Intervals: normal ST/T Wave abnormalities: Nonspecific ST and T wave abnormality Conduction Disturbances: none Narrative Interpretation: unremarkable *No acute ischemic changes   Radiology: * "Dg Chest 2 View  Result Date: 05/13/2016 CLINICAL DATA:  Chest pain headache EXAM: CHEST  2 VIEW COMPARISON:  04/08/2011 FINDINGS: Chronic right pleural thickening or scar. Ill-defined right middle lobe opacity suspicious for infiltrate. Normal heart size. No pneumothorax. Mild left carotid artery calcification. IMPRESSION: Ill-defined opacity in the right middle lobe is suspicious for infiltrate. Radiographic follow-up to resolution is recommended. Electronically Signed   By: Jasmine Pang M.D.   On: 05/13/2016 18:37  "   I personally reviewed the radiologic studies    ED Course:  Patient's stay here was uneventful and she was given IV Levaquin for community-acquired pneumonia be discharged on the same. I felt Levaquin would give her good coverage for an actual infiltrate. The patient was advised drink plenty of fluids and continue with Tylenol and/or ibuprofen at home for fever and body aches.  the patient does not appear to have other significant medical problems, low pulse oximetry, or other reasons to be hospitalized at this time.   Final Clinical Impression:  Final diagnoses:  Community acquired pneumonia of right middle lobe of lung (HCC)     Plan: Outpatient management " New Prescriptions   LEVOFLOXACIN (LEVAQUIN) 750 MG TABLET    Take 1 tablet (750 mg total) by mouth daily.  " Patient was cautioned on side effects of Levaquin  Patient was advised to return immediately if condition worsens. Patient was advised to follow up with their primary care physician or other specialized physicians  involved in their outpatient care. The patient and/or family member/power of attorney had laboratory results reviewed at the bedside. All questions and concerns were addressed and appropriate discharge instructions were distributed by the nursing staff.              Jennye Moccasin, MD 05/13/16 1610    Jennye Moccasin, MD 05/13/16 [phone]

## 2016-05-13 NOTE — Discharge Instructions (Signed)
Please drink plenty of fluids and controlling her fever with Tylenol and/or ibuprofen. Return here to emergency department for increased shortness of breath, persistent vomiting, or any other new concerns.  Please return immediately if condition worsens. Please contact her primary physician or the physician you were given for referral. If you have any specialist physicians involved in her treatment and plan please also contact them. Thank you for using [facility].

## 2016-05-13 NOTE — ED Notes (Signed)
FIRST NURSE NOTE: Pt fell and hit head recently, having anxiety, Headache, back ache productive cough. Pt arrived via EMS from home.  Pt also c/o abdominal pain and fever. EKG negative with EMS.  VSS.

## 2016-05-13 NOTE — ED Triage Notes (Signed)
Chest pain since Monday, URI sx, and headaches. Pt alert and oriented X4, active, cooperative, pt in NAD. RR even and unlabored, color WNL.

## 2016-10-03 ENCOUNTER — Emergency Department: Payer: Self-pay

## 2016-10-03 ENCOUNTER — Encounter: Payer: Self-pay | Admitting: Emergency Medicine

## 2016-10-03 ENCOUNTER — Emergency Department
Admission: EM | Admit: 2016-10-03 | Discharge: 2016-10-03 | Disposition: A | Discharge status: Home / Self Care | Payer: Self-pay | Attending: Emergency Medicine | Admitting: Emergency Medicine

## 2016-10-03 DIAGNOSIS — F17210 Nicotine dependence, cigarettes, uncomplicated: Secondary | ICD-10-CM | POA: Insufficient documentation

## 2016-10-03 DIAGNOSIS — Z8673 Personal history of transient ischemic attack (TIA), and cerebral infarction without residual deficits: Secondary | ICD-10-CM | POA: Insufficient documentation

## 2016-10-03 DIAGNOSIS — F419 Anxiety disorder, unspecified: Secondary | ICD-10-CM | POA: Insufficient documentation

## 2016-10-03 DIAGNOSIS — F32A Depression, unspecified: Secondary | ICD-10-CM

## 2016-10-03 DIAGNOSIS — F430 Acute stress reaction: Secondary | ICD-10-CM

## 2016-10-03 DIAGNOSIS — F410 Panic disorder [episodic paroxysmal anxiety] without agoraphobia: Secondary | ICD-10-CM

## 2016-10-03 DIAGNOSIS — F329 Major depressive disorder, single episode, unspecified: Secondary | ICD-10-CM | POA: Insufficient documentation

## 2016-10-03 HISTORY — DX: Cerebral infarction, unspecified: I63.9

## 2016-10-03 LAB — BASIC METABOLIC PANEL
ANION GAP: 10 (ref 5–15)
BUN: 29 mg/dL — AB (ref 6–20)
CALCIUM: 9.1 mg/dL (ref 8.9–10.3)
CO2: 22 mmol/L (ref 22–32)
CREATININE: 0.61 mg/dL (ref 0.44–1.00)
Chloride: 104 mmol/L (ref 101–111)
GFR calc Af Amer: >60 mL/min (ref >60)
GFR calc non Af Amer: >60 mL/min (ref >60)
GLUCOSE: 184 mg/dL — AB (ref 65–99)
Potassium: 3.6 mmol/L (ref 3.5–5.1)
Sodium: 136 mmol/L (ref 135–145)

## 2016-10-03 LAB — CBC
HCT: 40 % (ref 35.0–47.0)
HEMOGLOBIN: 14.2 g/dL (ref 12.0–16.0)
MCH: 31.5 pg (ref 26.0–34.0)
MCHC: 35.5 g/dL (ref 32.0–36.0)
MCV: 88.9 fL (ref 80.0–100.0)
Platelets: 272 10*3/uL (ref 150–440)
RBC: 4.5 MIL/uL (ref 3.80–5.20)
RDW: 13.4 % (ref 11.5–14.5)
WBC: 8.9 10*3/uL (ref 3.6–11.0)

## 2016-10-03 LAB — TROPONIN I

## 2016-10-03 IMAGING — CR DG CHEST 2V
1 series · 2 of 2 positions shown · non-contrast
Comparison: PA and lateral chest [DATE] and [DATE]. CT
chest [DATE].

CLINICAL DATA: Palpitations and left-side chest pain beginning this
morning.

EXAM:
CHEST  2 VIEW

[Series 1: dg chest 2 view · 0.14mm/px · 2 of 2 slices shown]
[im 1/2]
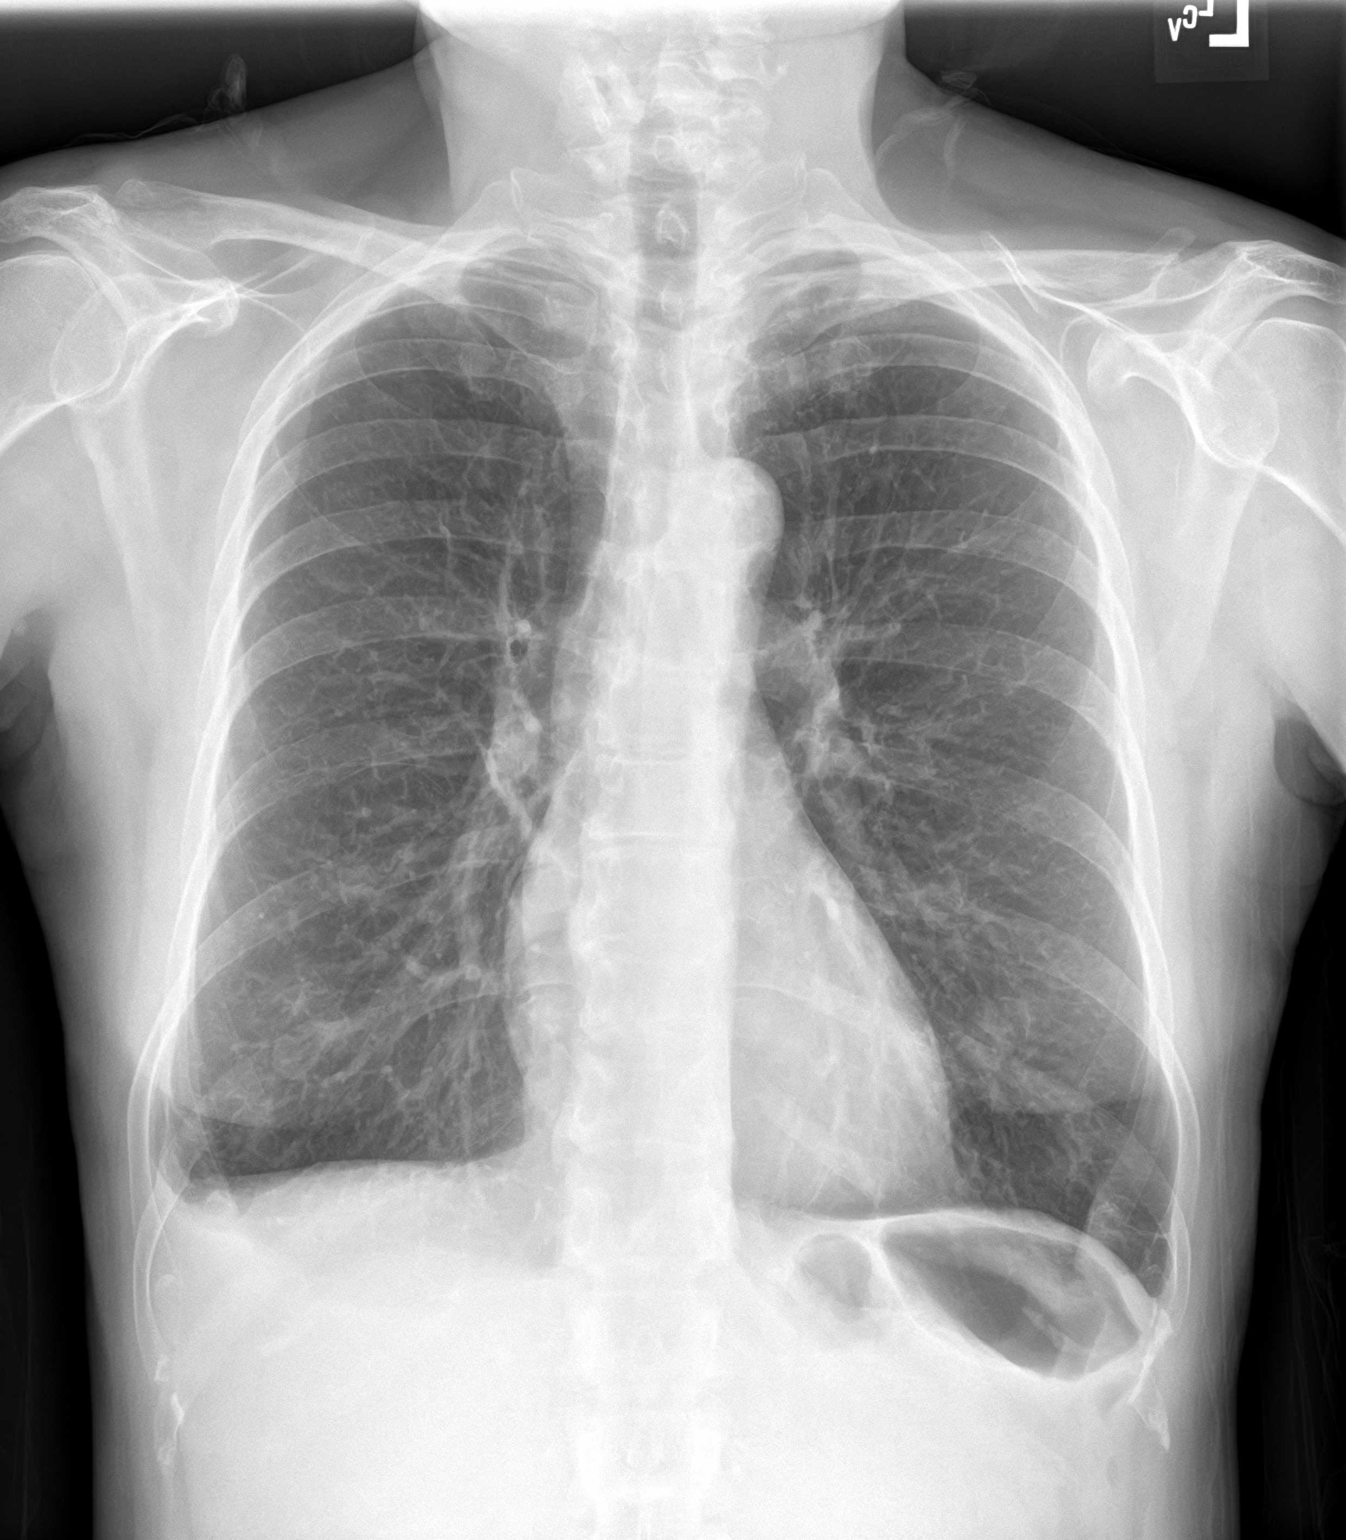
[im 2/2]
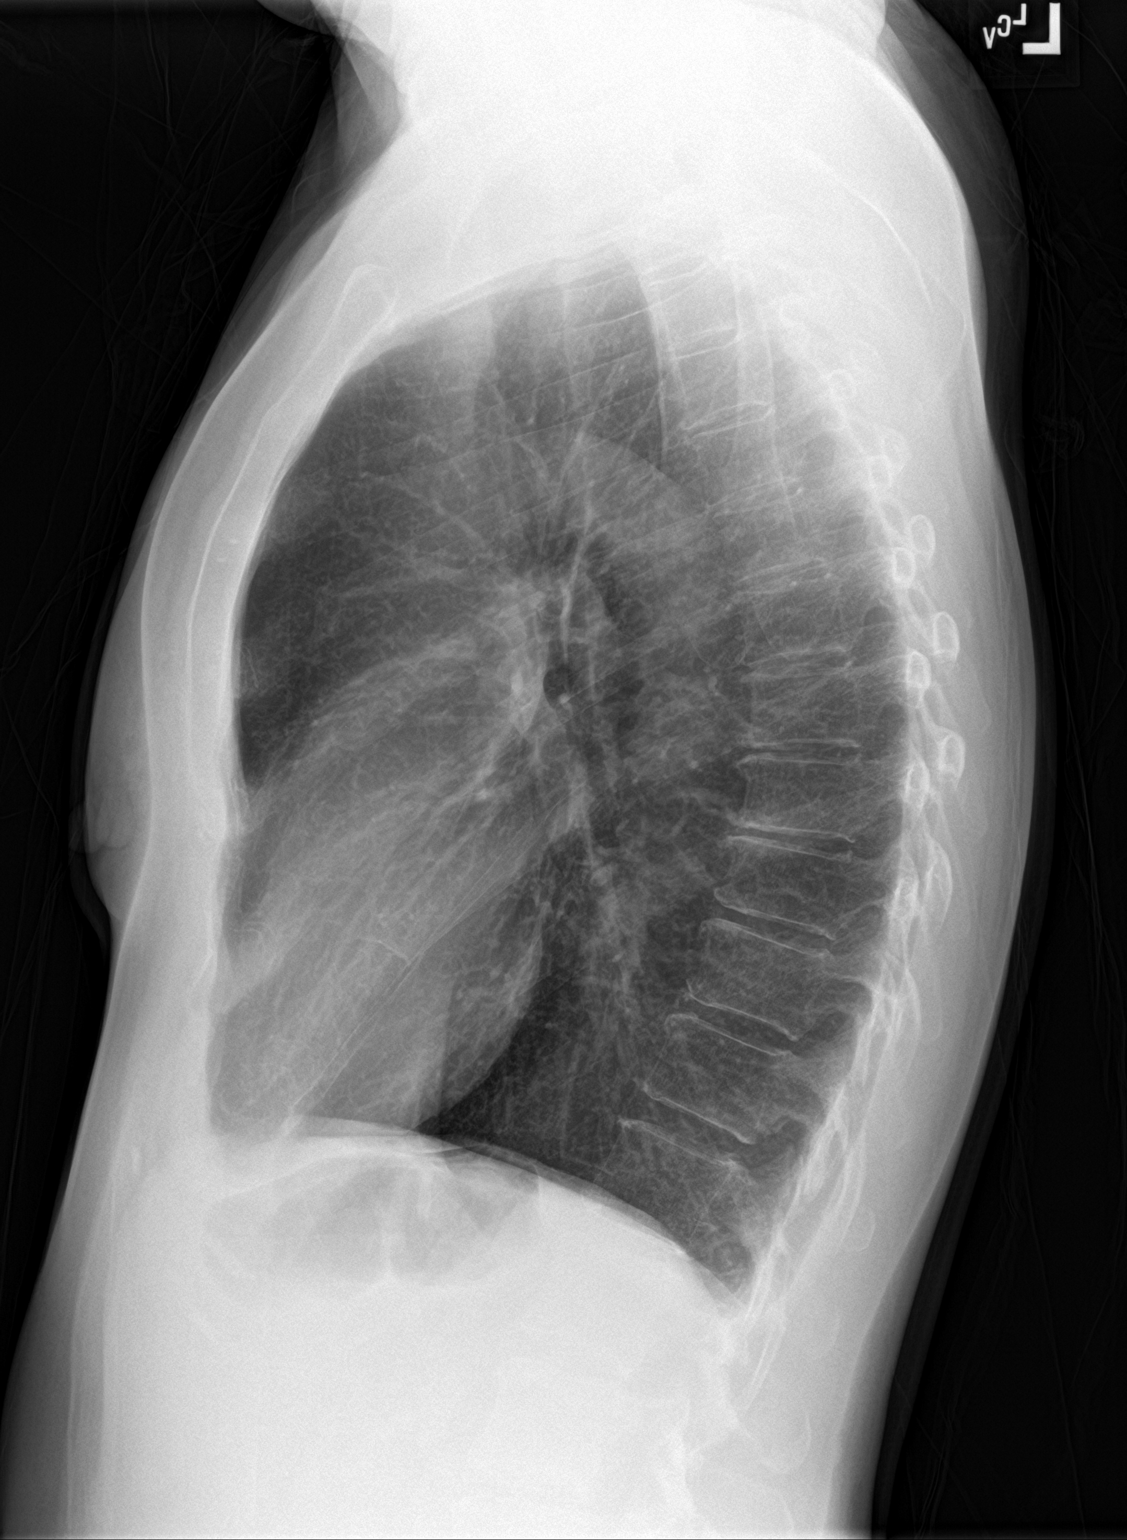

[2 of 2 positions shown; findings below may reference images not displayed]

FINDINGS: Chronic blunting of the right costophrenic angle consistent with
scar is unchanged. The lungs are emphysematous but clear. Heart size
is normal. No pneumothorax or pleural effusion. Atherosclerosis
noted. No acute bony abnormality.
IMPRESSION: No acute disease.

Emphysema.

Atherosclerosis.

## 2016-10-03 MED ORDER — ESCITALOPRAM OXALATE 10 MG PO TABS: 10.0000 mg | ORAL_TABLET | Freq: Every day | ORAL | 2 refills | Status: DC

## 2016-10-03 MED ORDER — ALPRAZOLAM 0.5 MG PO TABS: 0.5000 mg | ORAL_TABLET | Freq: Three times a day (TID) | ORAL | 0 refills | Status: AC | PRN

## 2016-10-03 MED ORDER — DIAZEPAM 5 MG PO TABS
10.0000 mg | ORAL_TABLET | Freq: Once | ORAL | Status: AC
  Administered 2016-10-03: 10 mg via ORAL
  Filled 2016-10-03: qty 2

## 2016-10-03 NOTE — ED Triage Notes (Addendum)
States awoke this morning at 0300 with c/o palpitations.  Patient states has significant family history for cardiac disease and MI.  Patient has history of CVA in the past.  Patient has history of anxiety and took last Xanax tablet (half of 0.5 mg).  Reports that brother died of MI in April.  Describes numerous life stressors and recent panic attacks since Thursday.  Patient states "I feel like a burden right now because I don't have any money".  Patient is anxious and tearful in triage.

## 2016-10-03 NOTE — ED Provider Notes (Signed)
[facility] Emergency Department Provider Note       Time seen: ----------------------------------------- 3:46 PM on 10/03/2016 -----------------------------------------     I have reviewed the triage vital signs and the nursing notes.   HISTORY   Chief Complaint Chest Pain    HPI Molly Greer is a 56 y.o. female who presents to the ED for palpitations. Patient states she has significant family for cardiac disease and MI. Patient reports history of anxiety and last took her Xanax today. She states she is running out of her Xanax and states she does not have her refill. She describes numerous life stressors and her brother recently died of a heart attack in 08/02/2022. Patient states she's not working right now and feels like a burden.   Past Medical History:  Diagnosis Date  . Anxiety   . Bone spur    right foot  . Stroke [facility])     There are no active problems to display for this patient.   Past Surgical History:  Procedure Laterality Date  . ROTATOR CUFF REPAIR    . TUBAL LIGATION      Allergies Penicillins  Social History Social History  Substance Use Topics  . Smoking status: Current Every Day Smoker    Packs/day: 0.50    Types: Cigarettes  . Smokeless tobacco: Never Used  . Alcohol use 0.0 oz/week    Review of Systems Constitutional: Negative for fever. Eyes: Negative for vision changes ENT:  Negative for congestion, sore throat Cardiovascular: Negative for chest pain.Positive for palpitations Respiratory: Negative for shortness of breath. Gastrointestinal: Negative for abdominal pain, vomiting and diarrhea. Genitourinary: Negative for dysuria. Musculoskeletal: Negative for back pain. Skin: Negative for rash. Neurological: Negative for headaches, focal weakness or numbness. Psychiatric: Positive for anxiety and depression  All systems negative/normal/unremarkable except as stated in the  HPI  ____________________________________________   PHYSICAL EXAM:  VITAL SIGNS: ED Triage Vitals  Enc Vitals Group     BP 10/03/16 1517 (!) 165/78     Pulse Rate 10/03/16 1517 91     Resp 10/03/16 1517 16     Temp --      Temp src --      SpO2 10/03/16 1517 98 %     Weight 10/03/16 1124 115 lb (52.2 kg)     Height 10/03/16 1124 5\' 2"  (1.575 m)     Head Circumference --      Peak Flow --      Pain Score 10/03/16 1123 8     Pain Loc --      Pain Edu? --      Excl. in GC? --     Constitutional: Alert and oriented. Tearful, no distress Eyes: Conjunctivae are normal. Normal extraocular movements. ENT   Head: Normocephalic and atraumatic.   Nose: No congestion/rhinnorhea.   Mouth/Throat: Mucous membranes are moist.   Neck: No stridor. Cardiovascular: Normal rate, regular rhythm. No murmurs, rubs, or gallops. Respiratory: Normal respiratory effort without tachypnea nor retractions. Breath sounds are clear and equal bilaterally. No wheezes/rales/rhonchi. Gastrointestinal: Soft and nontender. Normal bowel sounds Musculoskeletal: Nontender with normal range of motion in extremities. No lower extremity tenderness nor edema. Neurologic:  Normal speech and language. No gross focal neurologic deficits are appreciated.  Skin:  Skin is warm, dry and intact. No rash noted. Psychiatric: Mood and affect are normal. Speech and behavior are normal.  ____________________________________________  EKG: Interpreted by me. Sinus tachycardia at a rate of 109 bpm, normal. We'll, normal QRS,  normal QT.  ____________________________________________  ED COURSE:  Pertinent labs & imaging results that were available during my care of the patient were reviewed by me and considered in my medical decision making (see chart for details). Patient presents for anxiety related symptoms, we will assess with labs and imaging as indicated.    Procedures ____________________________________________   LABS (pertinent positives/negatives)  Labs Reviewed  BASIC METABOLIC PANEL - Abnormal; Notable for the following:       Result Value   Glucose, Bld 184 (*)    BUN 29 (*)    All other components within normal limits  CBC  TROPONIN I    RADIOLOGY Chest x-ray  Is unremarkable  ____________________________________________  FINAL ASSESSMENT AND PLAN  Anxiety, depression  Plan: Patient's labs and imaging were dictated above. Patient had presented for symptoms of intermittent panic attacks as well as depression due to the loss of her brother. Here she was given oral Valium and otherwise appears medically stable for outpatient follow-up.   Emily FilbertWilliams, Quantasia Stegner E, MD   Note: This note was generated in part or whole with voice recognition software. Voice recognition is usually quite accurate but there are transcription errors that can and very often do occur. I apologize for any typographical errors that were not detected and corrected.     Emily FilbertWilliams, Balinda Heacock E, MD 10/03/16 [phone]

## 2017-08-24 ENCOUNTER — Emergency Department
Admission: EM | Admit: 2017-08-24 | Discharge: 2017-08-24 | Disposition: A | Discharge status: Home / Self Care | Payer: Self-pay | Attending: Emergency Medicine | Admitting: Emergency Medicine

## 2017-08-24 ENCOUNTER — Encounter: Payer: Self-pay | Admitting: Emergency Medicine

## 2017-08-24 DIAGNOSIS — F17210 Nicotine dependence, cigarettes, uncomplicated: Secondary | ICD-10-CM | POA: Insufficient documentation

## 2017-08-24 DIAGNOSIS — Z79899 Other long term (current) drug therapy: Secondary | ICD-10-CM | POA: Insufficient documentation

## 2017-08-24 DIAGNOSIS — R21 Rash and other nonspecific skin eruption: Secondary | ICD-10-CM | POA: Insufficient documentation

## 2017-08-24 DIAGNOSIS — Z8614 Personal history of Methicillin resistant Staphylococcus aureus infection: Secondary | ICD-10-CM | POA: Insufficient documentation

## 2017-08-24 HISTORY — DX: Methicillin resistant Staphylococcus aureus infection, unspecified site: A49.02

## 2017-08-24 MED ORDER — CLINDAMYCIN HCL 300 MG PO CAPS: 300.0000 mg | ORAL_CAPSULE | Freq: Three times a day (TID) | ORAL | 0 refills | Status: AC

## 2017-08-24 NOTE — ED Provider Notes (Signed)
[facility] Emergency Department Provider Note  ____________________________________________  Time seen: Approximately 9:38 AM  I have reviewed the triage vital signs and the nursing notes.   HISTORY  Chief Complaint Eye Pain    HPI Molly Greer is a 57 y.o. female that presents to the emergency department for evaluation of painful and red spot to right cheek for 2 days.  She does not recall any insect bite.  No trauma to area.  She felt nauseous this morning but thinks that was due to anxiety.  She has a history of MRSA and is concerned this is the same.      Past Medical History:  Diagnosis Date  . Anxiety   . Bone spur    right foot  . MRSA infection   . Stroke [facility])     There are no active problems to display for this patient.   Past Surgical History:  Procedure Laterality Date  . ROTATOR CUFF REPAIR    . TUBAL LIGATION      Prior to Admission medications   Medication Sig Start Date End Date Taking? Authorizing Provider  traMADol (ULTRAM) 50 MG tablet Take 50 mg by mouth every 6 (six) hours as needed.   Yes [provider]  ALPRAZolam Prudy Feeler) 0.5 MG tablet Take 0.5 mg by mouth at bedtime as needed for anxiety.    [provider]  ALPRAZolam Prudy Feeler) 0.5 MG tablet Take 1 tablet (0.5 mg total) by mouth 3 (three) times daily as needed for sleep or anxiety. 10/03/16 10/03/17  Emily Filbert, MD  clindamycin (CLEOCIN) 300 MG capsule Take 1 capsule (300 mg total) by mouth 3 (three) times daily for 10 days. 08/24/17 09/03/17  Enid Derry, PA-C  escitalopram (LEXAPRO) 10 MG tablet Take 1 tablet (10 mg total) by mouth daily. 10/03/16 10/03/17  Emily Filbert, MD  gabapentin (NEURONTIN) 100 MG capsule Take 1 capsule (100 mg total) by mouth 3 (three) times daily. 10/25/15 10/24/16  Beers, Charmayne Sheer, PA-C  ibuprofen (ADVIL,MOTRIN) 800 MG tablet Take 800 mg by mouth every 8 (eight) hours as needed.    [provider]   levofloxacin (LEVAQUIN) 750 MG tablet Take 1 tablet (750 mg total) by mouth daily. 05/13/16   Jennye Moccasin, MD  oxyCODONE-acetaminophen (ROXICET) 5-325 MG tablet Take 1-2 tablets by mouth every 4 (four) hours as needed for severe pain. 10/25/15   Beers, Charmayne Sheer, PA-C  traMADol (ULTRAM) 50 MG tablet Take by mouth every 6 (six) hours as needed.    [provider]    Allergies Penicillins  No family history on file.  Social History Social History   Tobacco Use  . Smoking status: Current Every Day Smoker    Packs/day: 0.50    Types: Cigarettes  . Smokeless tobacco: Never Used  Substance Use Topics  . Alcohol use: Yes    Alcohol/week: 0.0 oz  . Drug use: No     Review of Systems  Constitutional: No fever/chills Cardiovascular: No chest pain. Respiratory: No SOB. Gastrointestinal: No nausea, no vomiting.  Musculoskeletal: Negative for musculoskeletal pain. Skin: Negative for abrasions, lacerations, ecchymosis. Neurological: Negative for headaches   ____________________________________________   PHYSICAL EXAM:  VITAL SIGNS: ED Triage Vitals [08/24/17 0834]  Enc Vitals Group     BP (!) 177/99     Pulse Rate 85     Resp 16     Temp 99.4 F (37.4 C)     Temp Source Oral  SpO2 97 %     Weight 122 lb (55.3 kg)     Height  (1.575 m)     Head Circumference      Peak Flow      Pain Score 8     Pain Loc      Pain Edu?      Excl. in GC?      Constitutional: Alert and oriented. Well appearing and in no acute distress. Eyes: Conjunctivae are normal. PERRL. EOMI. Head: Atraumatic. ENT:      Ears:      Nose: No congestion/rhinnorhea.      Mouth/Throat: Mucous membranes are moist.  Neck: No stridor.   Cardiovascular: Normal rate, regular rhythm.  Good peripheral circulation. Respiratory: Normal respiratory effort without tachypnea or retractions. Lungs CTAB. Good air entry to the bases with no decreased or absent breath sounds. Musculoskeletal:  Full range of motion to all extremities. No gross deformities appreciated. Neurologic:  Normal speech and language. No gross focal neurologic deficits are appreciated.  Skin:  Skin is warm, dry. 1/2cm by 1/2cm area of tenderness and erythema to right cheek. No swelling or fluctuance.    ____________________________________________   LABS (all labs ordered are listed, but only abnormal results are displayed)  Labs Reviewed - No data to display ____________________________________________  EKG   ____________________________________________  RADIOLOGY  No results found.  ____________________________________________    PROCEDURES  Procedure(s) performed:    Procedures    Medications - No data to display   ____________________________________________   INITIAL IMPRESSION / ASSESSMENT AND PLAN / ED COURSE  Pertinent labs & imaging results that were available during my care of the patient were reviewed by me and considered in my medical decision making (see chart for details).  Review of the [place] CSRS was performed in accordance of the [facility] prior to dispensing any controlled drugs.     Patient's diagnosis is consistent with skin infection. Vital signs and exam are reassuring. Patient has a history of MRSA and will be covered for MRSA. No drainable abscess. Patient's blood pressure is high in the emergency department.  This was discussed and she states that it has been getting higher in the last 6 months.  She is currently very anxious right now, which is likely contributing to blood pressure.  Patient will be discharged home with prescriptions for clindamycin. Patient is to follow up with PCP as directed. Patient is given ED precautions to return to the ED for any worsening or new symptoms.     ____________________________________________  FINAL CLINICAL IMPRESSION(S) / ED DIAGNOSES  Final diagnoses:  Rash and nonspecific skin eruption  History of MRSA infection       NEW MEDICATIONS STARTED DURING THIS VISIT:  ED Discharge Orders        Ordered    clindamycin (CLEOCIN) 300 MG capsule  3 times daily     08/24/17 0954          This chart was dictated using voice recognition software/Dragon. Despite best efforts to proofread, errors can occur which can change the meaning. Any change was purely unintentional.    Enid Derry, PA-C 08/24/17 1529    Don Perking, [place], MD 09/11/17 1017

## 2017-08-24 NOTE — ED Triage Notes (Signed)
Patient presents to ED via POV from home with c/o right eye pain. Patient states "I think its MRSA I have had that before". Small red area noted below eye.

## 2017-08-24 NOTE — ED Notes (Signed)
See triage note  States she noticed an area under right eye yesterday  Area is more painful and swollen this am   Hx of MRSA

## 2018-09-20 DIAGNOSIS — F17210 Nicotine dependence, cigarettes, uncomplicated: Secondary | ICD-10-CM | POA: Insufficient documentation

## 2018-09-20 DIAGNOSIS — Y908 Blood alcohol level of 240 mg/100 ml or more: Secondary | ICD-10-CM | POA: Insufficient documentation

## 2018-09-20 DIAGNOSIS — F10120 Alcohol abuse with intoxication, uncomplicated: Secondary | ICD-10-CM | POA: Insufficient documentation

## 2018-09-20 DIAGNOSIS — Z79899 Other long term (current) drug therapy: Secondary | ICD-10-CM | POA: Insufficient documentation

## 2018-09-20 DIAGNOSIS — K2920 Alcoholic gastritis without bleeding: Secondary | ICD-10-CM | POA: Insufficient documentation

## 2018-09-20 LAB — LIPASE, BLOOD: Lipase: 65 U/L — ABNORMAL HIGH (ref 11–51)

## 2018-09-20 LAB — CBC WITH DIFFERENTIAL/PLATELET
Abs Immature Granulocytes: 0.02 10*3/uL (ref 0.00–0.07)
Basophils Absolute: 0.1 10*3/uL (ref 0.0–0.1)
Basophils Relative: 1 %
Eosinophils Absolute: 0.4 10*3/uL (ref 0.0–0.5)
Eosinophils Relative: 5 %
HCT: 43.2 % (ref 36.0–46.0)
Hemoglobin: 14.9 g/dL (ref 12.0–15.0)
Immature Granulocytes: 0 %
Lymphocytes Relative: 45 %
Lymphs Abs: 3.9 10*3/uL (ref 0.7–4.0)
MCH: 32 pg (ref 26.0–34.0)
MCHC: 34.5 g/dL (ref 30.0–36.0)
MCV: 92.7 fL (ref 80.0–100.0)
Monocytes Absolute: 0.6 10*3/uL (ref 0.1–1.0)
Monocytes Relative: 7 %
Neutro Abs: 3.6 10*3/uL (ref 1.7–7.7)
Neutrophils Relative %: 42 %
Platelets: 252 10*3/uL (ref 150–400)
RBC: 4.66 MIL/uL (ref 3.87–5.11)
RDW: 13.1 % (ref 11.5–15.5)
WBC: 8.6 10*3/uL (ref 4.0–10.5)
nRBC: 0 % (ref 0.0–0.2)

## 2018-09-20 LAB — URINALYSIS, COMPLETE (UACMP) WITH MICROSCOPIC
Bilirubin Urine: NEGATIVE
Glucose, UA: NEGATIVE mg/dL
Ketones, ur: NEGATIVE mg/dL
Leukocytes,Ua: NEGATIVE
Nitrite: NEGATIVE
Protein, ur: NEGATIVE mg/dL
Specific Gravity, Urine: 1.009 (ref 1.005–1.030)
pH: 6 (ref 5.0–8.0)

## 2018-09-20 LAB — COMPREHENSIVE METABOLIC PANEL
ALT: 128 U/L — ABNORMAL HIGH (ref 0–44)
AST: 113 U/L — ABNORMAL HIGH (ref 15–41)
Albumin: 4.6 g/dL (ref 3.5–5.0)
Alkaline Phosphatase: 69 U/L (ref 38–126)
Anion gap: 10 (ref 5–15)
BUN: 13 mg/dL (ref 6–20)
CO2: 24 mmol/L (ref 22–32)
Calcium: 8.6 mg/dL — ABNORMAL LOW (ref 8.9–10.3)
Chloride: 107 mmol/L (ref 98–111)
Creatinine, Ser: 0.52 mg/dL (ref 0.44–1.00)
GFR calc Af Amer: >60 mL/min (ref >60)
GFR calc non Af Amer: >60 mL/min (ref >60)
Glucose, Bld: 106 mg/dL — ABNORMAL HIGH (ref 70–99)
Potassium: 3.4 mmol/L — ABNORMAL LOW (ref 3.5–5.1)
Sodium: 141 mmol/L (ref 135–145)
Total Bilirubin: 0.3 mg/dL (ref 0.3–1.2)
Total Protein: 8.2 g/dL — ABNORMAL HIGH (ref 6.5–8.1)

## 2018-09-20 MED ORDER — SODIUM CHLORIDE 0.9% FLUSH: 3.0000 mL | Freq: Once | INTRAVENOUS | Status: DC

## 2018-09-20 NOTE — ED Triage Notes (Signed)
Patient coming [facility] from home for RUQ abdominal pain X 5 weeks. Patient reports heavy daily drinking for the last several months. Patient reports hx anxiety and htn.

## 2018-09-21 ENCOUNTER — Emergency Department
Admission: EM | Admit: 2018-09-21 | Discharge: 2018-09-21 | Disposition: A | Discharge status: Home / Self Care | Payer: Self-pay | Attending: Emergency Medicine | Admitting: Emergency Medicine

## 2018-09-21 ENCOUNTER — Emergency Department: Payer: Self-pay

## 2018-09-21 DIAGNOSIS — F1010 Alcohol abuse, uncomplicated: Secondary | ICD-10-CM

## 2018-09-21 DIAGNOSIS — R1011 Right upper quadrant pain: Secondary | ICD-10-CM

## 2018-09-21 DIAGNOSIS — K2920 Alcoholic gastritis without bleeding: Secondary | ICD-10-CM

## 2018-09-21 DIAGNOSIS — F10920 Alcohol use, unspecified with intoxication, uncomplicated: Secondary | ICD-10-CM

## 2018-09-21 LAB — TROPONIN I: Troponin I: <0.03 ng/mL (ref <0.03)

## 2018-09-21 LAB — ETHANOL: Alcohol, Ethyl (B): 349 mg/dL (ref <10)

## 2018-09-21 MED ORDER — THIAMINE HCL 100 MG/ML IJ SOLN
Freq: Once | INTRAVENOUS | Status: AC
  Administered 2018-09-21: 04:00:00 via INTRAVENOUS
  Filled 2018-09-21: qty 1000

## 2018-09-21 MED ORDER — FAMOTIDINE IN NACL 20-0.9 MG/50ML-% IV SOLN
20.0000 mg | Freq: Once | INTRAVENOUS | Status: AC
  Administered 2018-09-21: 20 mg via INTRAVENOUS
  Filled 2018-09-21: qty 50

## 2018-09-21 MED ORDER — SUCRALFATE 1 G PO TABS
1.0000 g | ORAL_TABLET | Freq: Once | ORAL | Status: AC
  Administered 2018-09-21: 1 g via ORAL
  Filled 2018-09-21: qty 1

## 2018-09-21 MED ORDER — LORAZEPAM 2 MG/ML IJ SOLN
0.0000 mg | Freq: Four times a day (QID) | INTRAMUSCULAR | Status: DC
  Administered 2018-09-21: 1 mg via INTRAVENOUS
  Filled 2018-09-21: qty 1

## 2018-09-21 MED ORDER — SODIUM CHLORIDE 0.9 % IV BOLUS
1000.0000 mL | Freq: Once | INTRAVENOUS | Status: AC
  Administered 2018-09-21: 1000 mL via INTRAVENOUS

## 2018-09-21 MED ORDER — FAMOTIDINE 20 MG PO TABS: 20.0000 mg | ORAL_TABLET | Freq: Two times a day (BID) | ORAL | 0 refills | Status: DC

## 2018-09-21 MED ORDER — THIAMINE HCL 100 MG/ML IJ SOLN
100.0000 mg | Freq: Every day | INTRAMUSCULAR | Status: DC
  Administered 2018-09-21: 100 mg via INTRAVENOUS
  Filled 2018-09-21: qty 2

## 2018-09-21 NOTE — Discharge Instructions (Addendum)
Start Pepcid 20mg  twice daily. Drink alcohol only in moderation. Speak with your doctor about getting a repeat ultrasound in 6 months. Return to the ER for worsening symptoms, persistent vomiting, difficulty breathing or other concerns.

## 2018-09-21 NOTE — ED Notes (Signed)
NAD noted at time of D/C. Pt denies questions or concerns. Pt ambulatory to the lobby at this time with this RN.  

## 2018-09-21 NOTE — ED Notes (Signed)
Pt given meal tray, states was sitting up [place] style to eat and began having RLQ abdominal pain. MD made aware. See orders.

## 2018-09-21 NOTE — ED Provider Notes (Signed)
Patient awake and clinically sober.  Tolerating oral hydration.  Stable for discharge home with family.   Merlyn Lot, MD 09/21/18 1012

## 2018-09-21 NOTE — ED Provider Notes (Signed)
[facility] Emergency Department Provider Note   ____________________________________________   First MD Initiated Contact with Patient 09/21/18 0230     (approximate)  I have reviewed the triage vital signs and the nursing notes.   HISTORY  Chief Complaint Abdominal Pain    HPI Molly Greer is a 58 y.o. female brought to the ED from home via EMS with a chief complaint of abdominal pain.  Patient reports right upper quadrant abdominal pain x5 weeks.  States she has been drinking heavily since she has been laid off from work in mid March due to COVID-19.  States she drinks because she is bored.  Patient is tearful, denies SI/HI, would be interested in resources for outpatient rehab.  Denies fever, cough, chest pain, shortness of breath, nausea, vomiting, diarrhea.  Denies recent travel, trauma or exposure to persons diagnosed with coronavirus.       Past Medical History:  Diagnosis Date  . Anxiety   . Bone spur    right foot  . MRSA infection   . Stroke [facility])     There are no active problems to display for this patient.   Past Surgical History:  Procedure Laterality Date  . ROTATOR CUFF REPAIR    . TUBAL LIGATION      Prior to Admission medications   Medication Sig Start Date End Date Taking? Authorizing Provider  ALPRAZolam Prudy Feeler(XANAX) 0.5 MG tablet Take 0.5 mg by mouth at bedtime as needed for anxiety.    [provider]  escitalopram (LEXAPRO) 10 MG tablet Take 1 tablet (10 mg total) by mouth daily. 10/03/16 10/03/17  Emily FilbertWilliams, Jonathan E, MD  famotidine (PEPCID) 20 MG tablet Take 1 tablet (20 mg total) by mouth 2 (two) times daily. 09/21/18   Irean HongSung,  J, MD  gabapentin (NEURONTIN) 100 MG capsule Take 1 capsule (100 mg total) by mouth 3 (three) times daily. 10/25/15 10/24/16  Beers, Charmayne Sheerharles M, PA-C  ibuprofen (ADVIL,MOTRIN) 800 MG tablet Take 800 mg by mouth every 8 (eight) hours as needed.    [provider]  levofloxacin  (LEVAQUIN) 750 MG tablet Take 1 tablet (750 mg total) by mouth daily. 05/13/16   Jennye MoccasinQuigley, Brian S, MD  oxyCODONE-acetaminophen (ROXICET) 5-325 MG tablet Take 1-2 tablets by mouth every 4 (four) hours as needed for severe pain. 10/25/15   Beers, Charmayne Sheerharles M, PA-C  traMADol (ULTRAM) 50 MG tablet Take by mouth every 6 (six) hours as needed.    [provider]  traMADol (ULTRAM) 50 MG tablet Take 50 mg by mouth every 6 (six) hours as needed.    [provider]    Allergies Penicillins  No family history on file.  Social History Social History   Tobacco Use  . Smoking status: Current Every Day Smoker    Packs/day: 0.50    Types: Cigarettes  . Smokeless tobacco: Never Used  Substance Use Topics  . Alcohol use: Yes    Alcohol/week: 0.0 standard drinks  . Drug use: No    Review of Systems  Constitutional: No fever/chills Eyes: No visual changes. ENT: No sore throat. Cardiovascular: Denies chest pain. Respiratory: Denies shortness of breath. Gastrointestinal: Positive for abdominal pain.  No nausea, no vomiting.  No diarrhea.  No constipation. Genitourinary: Negative for dysuria. Musculoskeletal: Negative for back pain. Skin: Negative for rash. Neurological: Negative for headaches, focal weakness or numbness.   ____________________________________________   PHYSICAL EXAM:  VITAL SIGNS: ED Triage Vitals  Enc Vitals Group  BP 09/20/18 2308 (!) 174/110     Pulse Rate 09/20/18 2308 100     Resp 09/20/18 2308 20     Temp 09/20/18 2308 98 F (36.7 C)     Temp Source 09/20/18 2308 Oral     SpO2 09/20/18 2308 94 %     Weight 09/20/18 2309 121 lb 14.6 oz (55.3 kg)     Height 09/20/18 2309 5\' 2"  (1.575 m)     Head Circumference --      Peak Flow --      Pain Score 09/20/18 2308 5     Pain Loc --      Pain Edu? --      Excl. in GC? --     Constitutional: Alert and oriented. Well appearing and in mild acute distress. Eyes: Conjunctivae are normal. PERRL.  EOMI. Head: Atraumatic. Nose: No congestion/rhinnorhea. Mouth/Throat: Mucous membranes are moist.  Oropharynx non-erythematous. Neck: No stridor.   Cardiovascular: Normal rate, regular rhythm. Grossly normal heart sounds.  Good peripheral circulation. Respiratory: Normal respiratory effort.  No retractions. Lungs CTAB. Gastrointestinal: Soft and mildly tender to palpation right upper quadrant without rebound or guarding. No distention. No abdominal bruits. No CVA tenderness. Musculoskeletal: No lower extremity tenderness nor edema.  No joint effusions. Neurologic:  Normal speech and language. No gross focal neurologic deficits are appreciated. No gait instability. Skin:  Skin is warm, dry and intact. No rash noted. Psychiatric: Mood and affect are tearful. Speech and behavior are normal.  ____________________________________________   LABS (all labs ordered are listed, but only abnormal results are displayed)  Labs Reviewed  LIPASE, BLOOD - Abnormal; Notable for the following components:      Result Value   Lipase 65 (*)    All other components within normal limits  COMPREHENSIVE METABOLIC PANEL - Abnormal; Notable for the following components:   Potassium 3.4 (*)    Glucose, Bld 106 (*)    Calcium 8.6 (*)    Total Protein 8.2 (*)    AST 113 (*)    ALT 128 (*)    All other components within normal limits  URINALYSIS, COMPLETE (UACMP) WITH MICROSCOPIC - Abnormal; Notable for the following components:   Color, Urine STRAW (*)    APPearance CLEAR (*)    Hgb urine dipstick SMALL (*)    Bacteria, UA RARE (*)    All other components within normal limits  ETHANOL - Abnormal; Notable for the following components:   Alcohol, Ethyl (B) 349 (*)    All other components within normal limits  CBC WITH DIFFERENTIAL/PLATELET  TROPONIN I   ____________________________________________  EKG  ED ECG REPORT I, , J, the attending physician, personally viewed and interpreted this  ECG.   Date: 09/21/2018  EKG Time: 2315  Rate: 75  Rhythm: normal EKG, normal sinus rhythm  Axis: Normal  Intervals:right bundle branch block  ST&T Change: Nonspecific  ____________________________________________  RADIOLOGY  ED MD interpretation: Unremarkable ultrasound; likely liver hemangioma  Official radiology report(s): [place] Abdomen Limited Ruq  Result Date: 09/21/2018 CLINICAL DATA:  Right upper quadrant pain. EXAM: ULTRASOUND ABDOMEN LIMITED RIGHT UPPER QUADRANT COMPARISON:  CT 04/06/2011 FINDINGS: Gallbladder: Physiologically distended. No gallstones or wall thickening visualized. No sonographic Murphy sign noted by sonographer. Common bile duct: Diameter: 3 mm, normal. Liver: Well-defined echogenic 1.2 x 1.0 x 0.9 cm lesion in the left lobe. Background increased in parenchymal echogenicity. Portal vein is patent on color Doppler imaging with normal direction of blood flow towards the liver.  IMPRESSION: 1. Normal sonographic appearance of the gallbladder and biliary tree. 2. Mild hepatic steatosis. 3. A 12 mm echogenic left lobe liver lesion is nonspecific but likely a hemangioma. In the absence of malignancy history, recommend follow-up ultrasound in 6 months to confirm imaging stability. Electronically Signed   By: Keith Rake M.D.   On: 09/21/2018 01:18    ____________________________________________   PROCEDURES  Procedure(s) performed (including Critical Care):  Procedures   ____________________________________________   INITIAL IMPRESSION / ASSESSMENT AND PLAN / ED COURSE  As part of my medical decision making, I reviewed the following data within the [place] notes reviewed and incorporated, Labs reviewed, EKG interpreted, Old chart reviewed, Radiograph reviewed and Notes from prior ED visits     Valree Feild was evaluated in Emergency Department on 09/21/2018 for the symptoms described in the history of present illness. She was  evaluated in the context of the global COVID-19 pandemic, which necessitated consideration that the patient might be at risk for infection with the SARS-CoV-2 virus that causes COVID-19. Institutional protocols and algorithms that pertain to the evaluation of patients at risk for COVID-19 are in a state of rapid change based on information released by regulatory bodies including the CDC and federal and state organizations. These policies and algorithms were followed during the patient's care in the ED.   58 year old female who presents with a 5-week history of right upper quadrant abdominal pain in the setting of heavy EtOH use. Differential diagnosis includes, but is not limited to, biliary disease (biliary colic, acute cholecystitis, cholangitis, choledocholithiasis, etc), intrathoracic causes for epigastric abdominal pain including ACS, gastritis, duodenitis, pancreatitis, small bowel or large bowel obstruction, abdominal aortic aneurysm, hernia, and ulcer(s).  Patient's laboratory, imaging and urinalysis results remarkable for remarkably elevated EtOH, mildly elevated transaminases which is secondary to alcohol abuse.  Probable hemangioma seen on ultrasound; otherwise unremarkable.  Will initiate IV fluid resuscitation, banana bag, CIWA scale.   Clinical Course as of Sep 20 656  Fri Sep 21, 2018  0622 Patient sleeping soundly in no acute distress.  Anticipate discharge home once patient is sober and ambulatory with steady gait.   [JS]  [id number] Care transferred to Dr. Quentin Cornwall at change of shift.   [JS]    Clinical Course User Index [JS] Paulette Blanch, MD     ____________________________________________   FINAL CLINICAL IMPRESSION(S) / ED DIAGNOSES  Final diagnoses:  RUQ pain  Acute alcoholic gastritis without hemorrhage  Alcoholic intoxication without complication ([place])  Alcohol abuse     ED Discharge Orders         Ordered    famotidine (PEPCID) 20 MG tablet  2 times daily      09/21/18 8676           Note:  This document was prepared using Dragon voice recognition software and may include unintentional dictation errors.   Paulette Blanch, MD 09/21/18 [phone]

## 2018-09-21 NOTE — ED Notes (Signed)
Pt given phone, Molly Greer called regarding patient and seeing if patient needs a ride. Pt states he will be here at approx 1115 to pick patient up.

## 2018-09-21 NOTE — ED Notes (Signed)
This RN to bedside, pt resting in bed comfortably, yellow socks, fall bracelet applied to patient at this time due to patient being high fall risk. Pt given more water per her request. Pt requesting to know when she is going to go home.

## 2018-09-21 NOTE — ED Notes (Signed)
PT asleep at this time, call light in reach. Respirations even and unlabored.

## 2018-09-22 ENCOUNTER — Emergency Department: Payer: Self-pay

## 2018-09-22 ENCOUNTER — Other Ambulatory Visit: Payer: Self-pay

## 2018-09-22 ENCOUNTER — Encounter: Payer: Self-pay | Admitting: Emergency Medicine

## 2018-09-22 ENCOUNTER — Emergency Department
Admission: EM | Admit: 2018-09-22 | Discharge: 2018-09-23 | Disposition: A | Discharge status: Home / Self Care | Payer: Self-pay | Attending: Emergency Medicine | Admitting: Emergency Medicine

## 2018-09-22 DIAGNOSIS — F17210 Nicotine dependence, cigarettes, uncomplicated: Secondary | ICD-10-CM | POA: Insufficient documentation

## 2018-09-22 DIAGNOSIS — Z20828 Contact with and (suspected) exposure to other viral communicable diseases: Secondary | ICD-10-CM | POA: Insufficient documentation

## 2018-09-22 DIAGNOSIS — F10129 Alcohol abuse with intoxication, unspecified: Secondary | ICD-10-CM | POA: Insufficient documentation

## 2018-09-22 DIAGNOSIS — F1010 Alcohol abuse, uncomplicated: Secondary | ICD-10-CM

## 2018-09-22 DIAGNOSIS — Y908 Blood alcohol level of 240 mg/100 ml or more: Secondary | ICD-10-CM | POA: Insufficient documentation

## 2018-09-22 DIAGNOSIS — Z8673 Personal history of transient ischemic attack (TIA), and cerebral infarction without residual deficits: Secondary | ICD-10-CM | POA: Insufficient documentation

## 2018-09-22 DIAGNOSIS — F419 Anxiety disorder, unspecified: Secondary | ICD-10-CM | POA: Insufficient documentation

## 2018-09-22 DIAGNOSIS — Z79899 Other long term (current) drug therapy: Secondary | ICD-10-CM | POA: Insufficient documentation

## 2018-09-22 LAB — COMPREHENSIVE METABOLIC PANEL
ALT: 113 U/L — ABNORMAL HIGH (ref 0–44)
AST: 94 U/L — ABNORMAL HIGH (ref 15–41)
Albumin: 4.4 g/dL (ref 3.5–5.0)
Alkaline Phosphatase: 65 U/L (ref 38–126)
Anion gap: 13 (ref 5–15)
BUN: 9 mg/dL (ref 6–20)
CO2: 22 mmol/L (ref 22–32)
Calcium: 9.3 mg/dL (ref 8.9–10.3)
Chloride: 109 mmol/L (ref 98–111)
Creatinine, Ser: 0.6 mg/dL (ref 0.44–1.00)
GFR calc Af Amer: >60 mL/min (ref >60)
GFR calc non Af Amer: >60 mL/min (ref >60)
Glucose, Bld: 105 mg/dL — ABNORMAL HIGH (ref 70–99)
Potassium: 3.2 mmol/L — ABNORMAL LOW (ref 3.5–5.1)
Sodium: 144 mmol/L (ref 135–145)
Total Bilirubin: 0.6 mg/dL (ref 0.3–1.2)
Total Protein: 8 g/dL (ref 6.5–8.1)

## 2018-09-22 LAB — CBC
HCT: 42.8 % (ref 36.0–46.0)
Hemoglobin: 14.7 g/dL (ref 12.0–15.0)
MCH: 32 pg (ref 26.0–34.0)
MCHC: 34.3 g/dL (ref 30.0–36.0)
MCV: 93 fL (ref 80.0–100.0)
Platelets: 246 10*3/uL (ref 150–400)
RBC: 4.6 MIL/uL (ref 3.87–5.11)
RDW: 12.8 % (ref 11.5–15.5)
WBC: 8 10*3/uL (ref 4.0–10.5)
nRBC: 0 % (ref 0.0–0.2)

## 2018-09-22 LAB — ETHANOL: Alcohol, Ethyl (B): 331 mg/dL (ref <10)

## 2018-09-22 LAB — URINE DRUG SCREEN, QUALITATIVE (ARMC ONLY)
Amphetamines, Ur Screen: NOT DETECTED
Barbiturates, Ur Screen: NOT DETECTED
Benzodiazepine, Ur Scrn: NOT DETECTED
Cannabinoid 50 Ng, Ur ~~LOC~~: NOT DETECTED
Cocaine Metabolite,Ur ~~LOC~~: NOT DETECTED
MDMA (Ecstasy)Ur Screen: NOT DETECTED
Methadone Scn, Ur: NOT DETECTED
Opiate, Ur Screen: NOT DETECTED
Phencyclidine (PCP) Ur S: NOT DETECTED
Tricyclic, Ur Screen: NOT DETECTED

## 2018-09-22 LAB — MAGNESIUM: Magnesium: 2.1 mg/dL (ref 1.7–2.4)

## 2018-09-22 MED ORDER — THIAMINE HCL 100 MG/ML IJ SOLN
100.0000 mg | Freq: Once | INTRAMUSCULAR | Status: AC
  Administered 2018-09-22: 100 mg via INTRAVENOUS
  Filled 2018-09-22: qty 2

## 2018-09-22 MED ORDER — SODIUM CHLORIDE 0.9 % IV SOLN
1.0000 mg | Freq: Once | INTRAVENOUS | Status: AC
  Administered 2018-09-22: 1 mg via INTRAVENOUS
  Filled 2018-09-22: qty 0.2

## 2018-09-22 MED ORDER — LORAZEPAM 2 MG/ML IJ SOLN: 0.0000 mg | Freq: Four times a day (QID) | INTRAMUSCULAR | Status: DC

## 2018-09-22 MED ORDER — SODIUM CHLORIDE 0.9 % IV BOLUS
1000.0000 mL | Freq: Once | INTRAVENOUS | Status: AC
  Administered 2018-09-22: 1000 mL via INTRAVENOUS

## 2018-09-22 MED ORDER — LORAZEPAM 2 MG PO TABS: 0.0000 mg | ORAL_TABLET | Freq: Two times a day (BID) | ORAL | Status: DC

## 2018-09-22 MED ORDER — LORAZEPAM 2 MG PO TABS
0.0000 mg | ORAL_TABLET | Freq: Four times a day (QID) | ORAL | Status: DC
  Administered 2018-09-23 (×2): 1 mg via ORAL
  Filled 2018-09-22 (×2): qty 1

## 2018-09-22 MED ORDER — LORAZEPAM 2 MG/ML IJ SOLN: 0.0000 mg | Freq: Two times a day (BID) | INTRAMUSCULAR | Status: DC

## 2018-09-22 NOTE — ED Notes (Signed)
Pt states her friends/family set up a facility for her to go to out of state.

## 2018-09-22 NOTE — ED Provider Notes (Signed)
[facility] Emergency Department Provider Note  ____________________________________________   I have reviewed the triage vital signs and the nursing notes. Where available I have reviewed prior notes and, if possible and indicated, outside hospital notes.    HISTORY  Chief Complaint Alcohol Intoxication    HPI Molly Greer is a 58 y.o. female with a history of EtOH abuse, also used to be on Xanax until the end of March, has been in her house drinking she states since that time.  No SI no HI but does not want to go out.  She is worried about her ongoing drinking.  Drinks every day.  No withdrawal symptoms.  Feel that her anxiety is contributing, was seen here 2 days ago for EtOH intoxication.  She has no complaints of fever or cough.  She does admit to drinking today.  She states she does want to be admitted to detoxification program.  Past Medical History:  Diagnosis Date  . Anxiety   . Bone spur    right foot  . MRSA infection   . Stroke [facility](HCC)     There are no active problems to display for this patient.   Past Surgical History:  Procedure Laterality Date  . ROTATOR CUFF REPAIR    . TUBAL LIGATION      Prior to Admission medications   Medication Sig Start Date End Date Taking? Authorizing Provider  ALPRAZolam Prudy Feeler(XANAX) 0.5 MG tablet Take 0.5 mg by mouth at bedtime as needed for anxiety.    [provider]  escitalopram (LEXAPRO) 10 MG tablet Take 1 tablet (10 mg total) by mouth daily. 10/03/16 10/03/17  Emily FilbertWilliams, Jonathan E, MD  famotidine (PEPCID) 20 MG tablet Take 1 tablet (20 mg total) by mouth 2 (two) times daily. 09/21/18   Irean HongSung, Jade J, MD  gabapentin (NEURONTIN) 100 MG capsule Take 1 capsule (100 mg total) by mouth 3 (three) times daily. 10/25/15 10/24/16  Beers, Charmayne Sheerharles M, PA-C  ibuprofen (ADVIL,MOTRIN) 800 MG tablet Take 800 mg by mouth every 8 (eight) hours as needed.    [provider]  levofloxacin (LEVAQUIN) 750 MG tablet  Take 1 tablet (750 mg total) by mouth daily. 05/13/16   Jennye MoccasinQuigley, Brian S, MD  oxyCODONE-acetaminophen (ROXICET) 5-325 MG tablet Take 1-2 tablets by mouth every 4 (four) hours as needed for severe pain. 10/25/15   Beers, Charmayne Sheerharles M, PA-C  traMADol (ULTRAM) 50 MG tablet Take by mouth every 6 (six) hours as needed.    [provider]  traMADol (ULTRAM) 50 MG tablet Take 50 mg by mouth every 6 (six) hours as needed.    [provider]    Allergies Penicillins  History reviewed. No pertinent family history.  Social History Social History   Tobacco Use  . Smoking status: Current Every Day Smoker    Packs/day: 0.50    Types: Cigarettes  . Smokeless tobacco: Never Used  Substance Use Topics  . Alcohol use: Yes    Alcohol/week: 0.0 standard drinks  . Drug use: No    Review of Systems Constitutional: No fever/chills Eyes: No visual changes. ENT: No sore throat. No stiff neck no neck pain Cardiovascular: Denies chest pain. Respiratory: Denies shortness of breath. Gastrointestinal:   no vomiting.  No diarrhea.  No constipation. Genitourinary: Negative for dysuria. Musculoskeletal: Negative lower extremity swelling Skin: Negative for rash. Neurological: Negative for severe headaches, focal weakness or numbness.   ____________________________________________   PHYSICAL EXAM:  VITAL SIGNS: ED Triage Vitals  Enc Vitals  Group     BP 09/22/18 1444 133/72     Pulse Rate 09/22/18 1444 91     Resp 09/22/18 1444 20     Temp 09/22/18 1444 98.4 F (36.9 C)     Temp Source 09/22/18 1444 Oral     SpO2 09/22/18 1444 98 %     Weight 09/22/18 1444 140 lb (63.5 kg)     Height 09/22/18 1444 5\' 2"  (1.575 m)     Head Circumference --      Peak Flow --      Pain Score 09/22/18 1453 0     Pain Loc --      Pain Edu? --      Excl. in GC? --     Constitutional: Alert and oriented. Well appearing and in no acute distress.  Smells of alcohol however. Eyes: Conjunctivae are  normal Head: Atraumatic HEENT: No congestion/rhinnorhea. Mucous membranes are moist.  Oropharynx non-erythematous Neck:   Nontender with no meningismus, no masses, no stridor Cardiovascular: Normal rate, regular rhythm. Grossly normal heart sounds.  Good peripheral circulation. Respiratory: Normal respiratory effort.  No retractions. Lungs CTAB. Abdominal: Soft and nontender. No distention. No guarding no rebound Back:  There is no focal tenderness or step off.  there is no midline tenderness there are no lesions noted. there is no CVA tenderness Musculoskeletal: No lower extremity tenderness, no upper extremity tenderness. No joint effusions, no DVT signs strong distal pulses no edema Neurologic:  Normal speech and language. No gross focal neurologic deficits are appreciated.  Skin:  Skin is warm, dry and intact. No rash noted. Psychiatric: Mood and affect are anxious. Speech and behavior are normal.  ____________________________________________   LABS (all labs ordered are listed, but only abnormal results are displayed)  Labs Reviewed  COMPREHENSIVE METABOLIC PANEL - Abnormal; Notable for the following components:      Result Value   Potassium 3.2 (*)    Glucose, Bld 105 (*)    AST 94 (*)    ALT 113 (*)    All other components within normal limits  ETHANOL - Abnormal; Notable for the following components:   Alcohol, Ethyl (B) 331 (*)    All other components within normal limits  CBC  URINE DRUG SCREEN, QUALITATIVE ([facility] ONLY)  MAGNESIUM    Pertinent labs  results that were available during my care of the patient were reviewed by me and considered in my medical decision making (see chart for details). ____________________________________________  EKG  I personally interpreted any EKGs ordered by me or triage  ____________________________________________  RADIOLOGY  Pertinent labs & imaging results that were available during my care of the patient were reviewed by me and  considered in my medical decision making (see chart for details). If possible, patient and/or family made aware of any abnormal findings.  No results found. ____________________________________________    PROCEDURES  Procedure(s) performed: None  Procedures  Critical Care performed: None  ____________________________________________   INITIAL IMPRESSION / ASSESSMENT AND PLAN / ED COURSE  Pertinent labs & imaging results that were available during my care of the patient were reviewed by me and considered in my medical decision making (see chart for details).  Patient in no acute distress however she is requesting Detoxification is acutely intoxicated he had no medical complaints at this time.  Blood work thus far reassuring.  We will give thiamine and folate and fluids, I have talked to the [name] counselor about hopefully getting her help.  Obviously, this  is not a detox facility so we have to see about availability in other places and this is not something we control.   ____________________________________________   FINAL CLINICAL IMPRESSION(S) / ED DIAGNOSES  Final diagnoses:  ETOH abuse      This chart was dictated using voice recognition software.  Despite best efforts to proofread,  errors can occur which can change meaning.      Schuyler Amor, MD 09/22/18 1719

## 2018-09-22 NOTE — ED Notes (Signed)
Pt states "I haven't had a nerve pill in over 40 days" Pt states "I"m too afraid to go out in public"

## 2018-09-22 NOTE — BH Assessment (Addendum)
Pt is requesting alcohol detox treatment. [name] called the following facilities:  - [facility]: No female detox beds available [facility]: $1100.00 per day for detox tx (pt declined)  [name] will continue to attempt in referring pt to other local facilities for detox tx.  Pt provided verbal consent to contact family friend Marita Kansas: [phone]) in regards to disposition.

## 2018-09-22 NOTE — BHH Counselor (Addendum)
Spoke with [facility] in [place], they have available beds, but need to speak with the patient before they will accept a referral... Provided phone number to Nurse [facility] for patient to call to be interviewed over the phone... [phone] ext [phone]   Pt spoke with [facility];  Counselor spoke with [facility] and they are requesting a COVID-19 test before admission paperwork can be reviewed per their Doctor

## 2018-09-22 NOTE — ED Notes (Signed)
Pt daughter to desk to inquire about wait time. Informed her that pt has 1 patient that has been waiting longer than her

## 2018-09-22 NOTE — ED Notes (Signed)
tts in with pt - they will call around to see if pt will be accepted to alcohol detox. Pt is voluntary.

## 2018-09-22 NOTE — ED Triage Notes (Signed)
Pt arrived via POV from home, pt was brought in by daughter. Pt states she has a drinking problem and wants help.  Pt states she started drinking in March when she lost of her job at [employer]. Pt states she started drinking with Louanne Skye on TV, pt states she was drinking because she wasn't working.  PT states she drinks about a pint per day, last drank today.  Pt denies any thoughts of wanting to hurt herself. Pt states she started drinking out of fun and out of boredom.  PT states she has no prior hx of alcohol abuse.

## 2018-09-23 LAB — SARS CORONAVIRUS 2 BY RT PCR (HOSPITAL ORDER, PERFORMED IN ~~LOC~~ HOSPITAL LAB): SARS Coronavirus 2: NEGATIVE

## 2018-09-23 MED ORDER — FAMOTIDINE 20 MG PO TABS: 20.0000 mg | ORAL_TABLET | Freq: Two times a day (BID) | ORAL | 1 refills | Status: DC

## 2018-09-23 MED ORDER — NICOTINE 21 MG/24HR TD PT24
21.0000 mg | MEDICATED_PATCH | Freq: Once | TRANSDERMAL | Status: DC
  Administered 2018-09-23: 21 mg via TRANSDERMAL
  Filled 2018-09-23: qty 1

## 2018-09-23 MED ORDER — POTASSIUM CHLORIDE CRYS ER 20 MEQ PO TBCR
40.0000 meq | EXTENDED_RELEASE_TABLET | Freq: Once | ORAL | Status: AC
  Administered 2018-09-23: 40 meq via ORAL
  Filled 2018-09-23: qty 2

## 2018-09-23 NOTE — ED Provider Notes (Addendum)
-----------------------------------------   3:22 AM on 09/23/2018 -----------------------------------------   Blood pressure (!) 168/92, pulse 67, temperature 98.1 F (36.7 C), temperature source Oral, resp. rate 20, height 1.575 m (5\' 2" ), weight 61.2 kg, SpO2 100 %.  The patient is calm and cooperative at this time.  There have been no acute events since the last update.  Awaiting disposition plan from Behavioral Medicine team for possible placement at outside rehab facility.     ----------------------------------------- 6:09 AM on 09/23/2018 -----------------------------------------  I was just informed that in order to place the patient she needs a negative COVID-19 swab.  Although our resources are limited, I see no choice but ordered the rapid swab because otherwise the patient will be in the emergency department for days awaiting placement while the send out test comes back.  This is apparently the only thing that is holding up for placement for alcohol detox.  I have ordered the rapid swab.   Hinda Kehr, MD 09/23/18 [phone]

## 2018-09-23 NOTE — ED Notes (Addendum)
Pt understands that she is going to [facility] in [place], [place]. Pt calling friend to take her between 36 and 10pm. Pt prescriptions updated on computer. Pt going to refill and bring current medications. Marita Kansas, friend, is taking patient to pick up prescriptions and to [place].

## 2018-09-23 NOTE — BH Assessment (Signed)
[name] telephoned [facility] [phone] ext [phone]) to inquire about what they need to facilitate admission for the patient.    [facility] requested the following: assessment and labs in order for patient information to be reviewed by nurse manager & doctor.   Patient would need to provide their own transportation to the facility and would need to provide transportation from the facility to their home.

## 2018-09-23 NOTE — ED Notes (Signed)
Spoke to [facility] Tasha who is going to speak to [facility] about admission to their program.

## 2018-09-23 NOTE — ED Notes (Signed)
Pt hits call bell and is more anxious and shaking than previously because of going to this new place. Pt asks for something to calm her down. Pt CIWA reevaluated. See MAR.

## 2018-09-23 NOTE — ED Notes (Signed)
Pt has decided on going to bed available bed at [facility]. Molly Greer, [facility] notified.

## 2018-09-23 NOTE — BH Assessment (Signed)
Patient reports she has completed the online application for [employer]

## 2018-09-23 NOTE — ED Notes (Signed)
Pt given meal tray.

## 2018-09-23 NOTE — BH Assessment (Signed)
Referral information was faxed to [place], [place].

## 2018-09-23 NOTE — BH Assessment (Addendum)
Referral information was faxed to [facility]

## 2018-09-23 NOTE — BH Assessment (Signed)
[name] telephoned [facility] and left a HIPPA client message requesting a return call about patient.   [name] telephoned [facility] to talk with Ms. Worley about patient.  Patient referral information will be reviewed by staff.

## 2018-09-23 NOTE — BH Assessment (Signed)
[name] consulted with patient via tele. Patient denied SI/HI or AVH.  Patient requested assistance with admission to substance use treatment and/or detoxification. Patient gave verbal consent for necessary referral information to be shared with treatment facilities.   Patient is most interested in [facility]; however, she will need to initiate the application process.   Patient is also interested in [facility] and [facility].   Patient and [name] also discussed the use of community resources including 12-step recovery programs to support sobriety.   Patient wants to talk more with her support person, Marita [place], to consider her options.

## 2018-09-23 NOTE — ED Notes (Addendum)
Pt given towels and wash cloth and supplies to shower. Pt is alert and oriented. Pt is up in room walking around without difficulty.   Pt was give coffee per her request.

## 2018-09-23 NOTE — BH Assessment (Addendum)
15:39 [facility], Nurse Patty telephoned. [facility] reviewed patient and noted that her potassium is low.  Her blood pressure is high, and they want to be sure this is related to alcohol withdrawal rather than organic hypertension.  If patient is generally hypertensive, then she will need to come with anti-hypertensive medications.  In reviewing her medications, they note there is historical prescription of an array of medications including opioids and benzodiazepines.  Patient would need to produce current prescription withdrawals and demonstrate compliance with therapy regiment.   Facility is in [place], [place].  Patient would need to provide her own transportation to arrive 7pm to 10pm on 09/23/2018 or 10am to 2pm on 09/24/2018. Patient/Nurse may telephone [facility] for additional information as needed: Nurse [place], [phone] .  [address], [place], [place] [address]

## 2018-09-23 NOTE — BH Assessment (Signed)
[name] received a phone call from [facility] inquiring about the patient's options for detoxification treatment.    [name] explained options for [facility], [facility] and the patient's choice to go to [facility].    Molly Greer is agreeable to pick the patient up and transport to a facility that offers admission.

## 2018-09-23 NOTE — Discharge Instructions (Signed)
Please go straight to [facility].  Do not stop anywhere else except to  pick up your medications.  Please return for any further problems.

## 2020-01-31 ENCOUNTER — Emergency Department: Payer: Self-pay

## 2020-01-31 ENCOUNTER — Emergency Department
Admission: EM | Admit: 2020-01-31 | Discharge: 2020-02-01 | Disposition: A | Discharge status: Home / Self Care | Payer: Self-pay | Attending: Emergency Medicine | Admitting: Emergency Medicine

## 2020-01-31 ENCOUNTER — Other Ambulatory Visit: Payer: Self-pay

## 2020-01-31 DIAGNOSIS — I10 Essential (primary) hypertension: Secondary | ICD-10-CM | POA: Insufficient documentation

## 2020-01-31 DIAGNOSIS — J45909 Unspecified asthma, uncomplicated: Secondary | ICD-10-CM | POA: Insufficient documentation

## 2020-01-31 DIAGNOSIS — S32048A Other fracture of fourth lumbar vertebra, initial encounter for closed fracture: Secondary | ICD-10-CM | POA: Insufficient documentation

## 2020-01-31 DIAGNOSIS — Z880 Allergy status to penicillin: Secondary | ICD-10-CM | POA: Insufficient documentation

## 2020-01-31 DIAGNOSIS — T1490XA Injury, unspecified, initial encounter: Secondary | ICD-10-CM

## 2020-01-31 DIAGNOSIS — R109 Unspecified abdominal pain: Secondary | ICD-10-CM | POA: Insufficient documentation

## 2020-01-31 DIAGNOSIS — R0781 Pleurodynia: Secondary | ICD-10-CM | POA: Insufficient documentation

## 2020-01-31 DIAGNOSIS — F17210 Nicotine dependence, cigarettes, uncomplicated: Secondary | ICD-10-CM | POA: Insufficient documentation

## 2020-01-31 DIAGNOSIS — M25552 Pain in left hip: Secondary | ICD-10-CM | POA: Insufficient documentation

## 2020-01-31 DIAGNOSIS — Z20822 Contact with and (suspected) exposure to covid-19: Secondary | ICD-10-CM | POA: Insufficient documentation

## 2020-01-31 HISTORY — DX: Unspecified asthma, uncomplicated: J45.909

## 2020-01-31 HISTORY — DX: Essential (primary) hypertension: I10

## 2020-01-31 LAB — CBC WITH DIFFERENTIAL/PLATELET
Abs Immature Granulocytes: 0.08 10*3/uL — ABNORMAL HIGH (ref 0.00–0.07)
Basophils Absolute: 0.1 10*3/uL (ref 0.0–0.1)
Basophils Relative: 1 %
Eosinophils Absolute: 0.3 10*3/uL (ref 0.0–0.5)
Eosinophils Relative: 3 %
HCT: 36 % (ref 36.0–46.0)
Hemoglobin: 12.5 g/dL (ref 12.0–15.0)
Immature Granulocytes: 1 %
Lymphocytes Relative: 47 %
Lymphs Abs: 4.2 10*3/uL — ABNORMAL HIGH (ref 0.7–4.0)
MCH: 31 pg (ref 26.0–34.0)
MCHC: 34.7 g/dL (ref 30.0–36.0)
MCV: 89.3 fL (ref 80.0–100.0)
Monocytes Absolute: 0.6 10*3/uL (ref 0.1–1.0)
Monocytes Relative: 7 %
Neutro Abs: 3.7 10*3/uL (ref 1.7–7.7)
Neutrophils Relative %: 41 %
Platelets: 225 10*3/uL (ref 150–400)
RBC: 4.03 MIL/uL (ref 3.87–5.11)
RDW: 12.5 % (ref 11.5–15.5)
WBC: 9 10*3/uL (ref 4.0–10.5)
nRBC: 0 % (ref 0.0–0.2)

## 2020-01-31 LAB — BASIC METABOLIC PANEL
Anion gap: 8 (ref 5–15)
BUN: 14 mg/dL (ref 6–20)
CO2: 22 mmol/L (ref 22–32)
Calcium: 8.7 mg/dL — ABNORMAL LOW (ref 8.9–10.3)
Chloride: 108 mmol/L (ref 98–111)
Creatinine, Ser: 0.58 mg/dL (ref 0.44–1.00)
GFR, Estimated: >60 mL/min (ref >60)
Glucose, Bld: 101 mg/dL — ABNORMAL HIGH (ref 70–99)
Potassium: 3.7 mmol/L (ref 3.5–5.1)
Sodium: 138 mmol/L (ref 135–145)

## 2020-01-31 LAB — TROPONIN I (HIGH SENSITIVITY): Troponin I (High Sensitivity): 4 ng/L (ref <18)

## 2020-01-31 MED ORDER — FENTANYL CITRATE (PF) 100 MCG/2ML IJ SOLN
50.0000 ug | Freq: Once | INTRAMUSCULAR | Status: AC
  Administered 2020-01-31: 50 ug via INTRAVENOUS
  Filled 2020-01-31: qty 2

## 2020-01-31 MED ORDER — MORPHINE SULFATE (PF) 4 MG/ML IV SOLN
4.0000 mg | Freq: Once | INTRAVENOUS | Status: AC
  Administered 2020-01-31: 4 mg via INTRAVENOUS
  Filled 2020-01-31: qty 1

## 2020-01-31 MED ORDER — IOHEXOL 300 MG/ML  SOLN
100.0000 mL | Freq: Once | INTRAMUSCULAR | Status: AC | PRN
  Administered 2020-01-31: 100 mL via INTRAVENOUS
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
## 2020-01-31 NOTE — ED Notes (Signed)
Pt gives verbal consent to update daughter Ilean China and partner Thayer Ohm, listed in chart, on care and status
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
## 2020-01-31 NOTE — ED Notes (Signed)
Pt denies LOC, chest pain, does confirm mild SOB
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
## 2020-02-01 LAB — TYPE AND SCREEN
ABO/RH(D): A NEG
Antibody Screen: NEGATIVE

## 2020-02-01 MED ORDER — KETOROLAC TROMETHAMINE 30 MG/ML IJ SOLN
15.0000 mg | Freq: Once | INTRAMUSCULAR | Status: AC
  Administered 2020-02-01: 15 mg via INTRAVENOUS
  Filled 2020-02-01: qty 1

## 2020-02-01 MED ORDER — ACETAMINOPHEN 500 MG PO TABS
1000.0000 mg | ORAL_TABLET | Freq: Once | ORAL | Status: AC
  Administered 2020-02-01: 1000 mg via ORAL
  Filled 2020-02-01: qty 2
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
## 2020-02-01 NOTE — Discharge Instructions (Signed)
Please take Tylenol and ibuprofen/Advil for your pain.  It is safe to take them together, or to alternate them every few hours.  Take up to 1000mg  of Tylenol at a time, up to 4 times per day.  Do not take more than 4000 mg of Tylenol in 24 hours.  For ibuprofen, take 400-600 mg, 4-5 times per day.  If you develop any fevers or inability to walk, please return to the ED.

## 2020-02-03 ENCOUNTER — Encounter: Payer: Self-pay | Admitting: Radiology

## 2020-02-03 ENCOUNTER — Emergency Department
Admission: EM | Admit: 2020-02-03 | Discharge: 2020-02-03 | Disposition: A | Discharge status: Home / Self Care | Payer: Self-pay | Attending: Student in an Organized Health Care Education/Training Program | Admitting: Student in an Organized Health Care Education/Training Program

## 2020-02-03 ENCOUNTER — Other Ambulatory Visit: Payer: Self-pay

## 2020-02-03 ENCOUNTER — Emergency Department: Payer: Self-pay

## 2020-02-03 DIAGNOSIS — F17210 Nicotine dependence, cigarettes, uncomplicated: Secondary | ICD-10-CM | POA: Insufficient documentation

## 2020-02-03 DIAGNOSIS — I10 Essential (primary) hypertension: Secondary | ICD-10-CM | POA: Insufficient documentation

## 2020-02-03 DIAGNOSIS — M25559 Pain in unspecified hip: Secondary | ICD-10-CM | POA: Insufficient documentation

## 2020-02-03 DIAGNOSIS — J45909 Unspecified asthma, uncomplicated: Secondary | ICD-10-CM | POA: Insufficient documentation

## 2020-02-03 DIAGNOSIS — M25532 Pain in left wrist: Secondary | ICD-10-CM

## 2020-02-03 DIAGNOSIS — Z79899 Other long term (current) drug therapy: Secondary | ICD-10-CM | POA: Insufficient documentation

## 2020-02-03 DIAGNOSIS — M25562 Pain in left knee: Secondary | ICD-10-CM | POA: Insufficient documentation

## 2020-02-03 DIAGNOSIS — M545 Low back pain, unspecified: Secondary | ICD-10-CM | POA: Insufficient documentation

## 2020-02-03 DIAGNOSIS — Y92410 Unspecified street and highway as the place of occurrence of the external cause: Secondary | ICD-10-CM | POA: Insufficient documentation

## 2020-02-03 DIAGNOSIS — R1013 Epigastric pain: Secondary | ICD-10-CM | POA: Insufficient documentation

## 2020-02-03 LAB — COMPREHENSIVE METABOLIC PANEL
ALT: 36 U/L (ref 0–44)
AST: 33 U/L (ref 15–41)
Albumin: 4.4 g/dL (ref 3.5–5.0)
Alkaline Phosphatase: 43 U/L (ref 38–126)
Anion gap: 11 (ref 5–15)
BUN: 14 mg/dL (ref 6–20)
CO2: 23 mmol/L (ref 22–32)
Calcium: 9.4 mg/dL (ref 8.9–10.3)
Chloride: 107 mmol/L (ref 98–111)
Creatinine, Ser: 0.68 mg/dL (ref 0.44–1.00)
GFR, Estimated: >60 mL/min (ref >60)
Glucose, Bld: 137 mg/dL — ABNORMAL HIGH (ref 70–99)
Potassium: 3.6 mmol/L (ref 3.5–5.1)
Sodium: 141 mmol/L (ref 135–145)
Total Bilirubin: 0.9 mg/dL (ref 0.3–1.2)
Total Protein: 7.3 g/dL (ref 6.5–8.1)

## 2020-02-03 LAB — CBC WITH DIFFERENTIAL/PLATELET
Abs Immature Granulocytes: 0.05 10*3/uL (ref 0.00–0.07)
Basophils Absolute: 0.1 10*3/uL (ref 0.0–0.1)
Basophils Relative: 0 %
Eosinophils Absolute: 0.1 10*3/uL (ref 0.0–0.5)
Eosinophils Relative: 0 %
HCT: 39.9 % (ref 36.0–46.0)
Hemoglobin: 13.4 g/dL (ref 12.0–15.0)
Immature Granulocytes: 0 %
Lymphocytes Relative: 16 %
Lymphs Abs: 1.8 10*3/uL (ref 0.7–4.0)
MCH: 30.2 pg (ref 26.0–34.0)
MCHC: 33.6 g/dL (ref 30.0–36.0)
MCV: 89.9 fL (ref 80.0–100.0)
Monocytes Absolute: 0.6 10*3/uL (ref 0.1–1.0)
Monocytes Relative: 5 %
Neutro Abs: 8.8 10*3/uL — ABNORMAL HIGH (ref 1.7–7.7)
Neutrophils Relative %: 79 %
Platelets: 248 10*3/uL (ref 150–400)
RBC: 4.44 MIL/uL (ref 3.87–5.11)
RDW: 12.6 % (ref 11.5–15.5)
WBC: 11.4 10*3/uL — ABNORMAL HIGH (ref 4.0–10.5)
nRBC: 0 % (ref 0.0–0.2)

## 2020-02-03 LAB — TROPONIN I (HIGH SENSITIVITY)
Troponin I (High Sensitivity): 4 ng/L (ref <18)
Troponin I (High Sensitivity): 4 ng/L (ref <18)

## 2020-02-03 LAB — LIPASE, BLOOD: Lipase: 58 U/L — ABNORMAL HIGH (ref 11–51)

## 2020-02-03 MED ORDER — IOHEXOL 300 MG/ML  SOLN
75.0000 mL | Freq: Once | INTRAMUSCULAR | Status: AC | PRN
  Administered 2020-02-03: 75 mL via INTRAVENOUS

## 2020-02-03 MED ORDER — MORPHINE SULFATE (PF) 4 MG/ML IV SOLN
4.0000 mg | INTRAVENOUS | Status: DC | PRN
  Administered 2020-02-03: 4 mg via INTRAVENOUS
  Filled 2020-02-03: qty 1

## 2020-02-03 MED ORDER — SUCRALFATE 1 G PO TABS
1.0000 g | ORAL_TABLET | Freq: Once | ORAL | Status: AC
  Administered 2020-02-03: 1 g via ORAL
  Filled 2020-02-03: qty 1

## 2020-02-03 MED ORDER — PROMETHAZINE HCL 25 MG/ML IJ SOLN
12.5000 mg | Freq: Four times a day (QID) | INTRAMUSCULAR | Status: DC | PRN
  Administered 2020-02-03: 12.5 mg via INTRAVENOUS
  Filled 2020-02-03: qty 1

## 2020-02-03 MED ORDER — HYDROCODONE-ACETAMINOPHEN 5-325 MG PO TABS: 1.0000 | ORAL_TABLET | ORAL | 0 refills | Status: DC | PRN

## 2020-02-03 MED ORDER — LORAZEPAM 1 MG PO TABS
1.0000 mg | ORAL_TABLET | Freq: Once | ORAL | Status: AC
  Administered 2020-02-03: 1 mg via ORAL
  Filled 2020-02-03: qty 1

## 2020-02-03 MED ORDER — PROMETHAZINE HCL 12.5 MG PO TABS: 12.5000 mg | ORAL_TABLET | Freq: Four times a day (QID) | ORAL | 0 refills | Status: DC | PRN
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
## 2020-02-03 NOTE — Discharge Instructions (Addendum)
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
## 2020-02-03 NOTE — ED Notes (Signed)
Pt ambulated in room without incident.  Pt continually asking for emesis bag, no emesis noted in any bags.  Provider notified.

## 2020-02-03 NOTE — ED Notes (Signed)
D/C instructions given.  Advised pt not to drive.  Released to lobby to await ride. Understanding verbalized.  IV catheter removed intact without complication.

## 2020-02-03 NOTE — ED Triage Notes (Signed)
Pt c/o abd pain 10/10 that started after taking ibuprofen. Pt with nausea and vomiting in triage.
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
## 2021-04-14 ENCOUNTER — Encounter: Payer: Self-pay | Admitting: Emergency Medicine

## 2021-04-14 ENCOUNTER — Other Ambulatory Visit: Payer: Self-pay

## 2021-04-14 ENCOUNTER — Emergency Department: Payer: Self-pay

## 2021-04-14 ENCOUNTER — Emergency Department
Admission: EM | Admit: 2021-04-14 | Discharge: 2021-04-15 | Disposition: A | Discharge status: Home / Self Care | Payer: Self-pay | Attending: Student in an Organized Health Care Education/Training Program | Admitting: Student in an Organized Health Care Education/Training Program

## 2021-04-14 DIAGNOSIS — F1990 Other psychoactive substance use, unspecified, uncomplicated: Secondary | ICD-10-CM | POA: Diagnosis present

## 2021-04-14 DIAGNOSIS — R1031 Right lower quadrant pain: Secondary | ICD-10-CM | POA: Insufficient documentation

## 2021-04-14 DIAGNOSIS — F1910 Other psychoactive substance abuse, uncomplicated: Secondary | ICD-10-CM | POA: Insufficient documentation

## 2021-04-14 DIAGNOSIS — F1010 Alcohol abuse, uncomplicated: Secondary | ICD-10-CM | POA: Insufficient documentation

## 2021-04-14 DIAGNOSIS — Z046 Encounter for general psychiatric examination, requested by authority: Secondary | ICD-10-CM | POA: Insufficient documentation

## 2021-04-14 DIAGNOSIS — F22 Delusional disorders: Secondary | ICD-10-CM | POA: Insufficient documentation

## 2021-04-14 DIAGNOSIS — Y908 Blood alcohol level of 240 mg/100 ml or more: Secondary | ICD-10-CM | POA: Insufficient documentation

## 2021-04-14 DIAGNOSIS — Z20822 Contact with and (suspected) exposure to covid-19: Secondary | ICD-10-CM | POA: Insufficient documentation

## 2021-04-14 LAB — URINE DRUG SCREEN, QUALITATIVE (ARMC ONLY)
Amphetamines, Ur Screen: POSITIVE — AB
Barbiturates, Ur Screen: NOT DETECTED
Benzodiazepine, Ur Scrn: NOT DETECTED
Cannabinoid 50 Ng, Ur ~~LOC~~: NOT DETECTED
Cocaine Metabolite,Ur ~~LOC~~: POSITIVE — AB
MDMA (Ecstasy)Ur Screen: NOT DETECTED
Methadone Scn, Ur: NOT DETECTED
Opiate, Ur Screen: NOT DETECTED
Phencyclidine (PCP) Ur S: NOT DETECTED
Tricyclic, Ur Screen: NOT DETECTED

## 2021-04-14 LAB — CBC
HCT: 41.3 % (ref 36.0–46.0)
Hemoglobin: 14.4 g/dL (ref 12.0–15.0)
MCH: 31 pg (ref 26.0–34.0)
MCHC: 34.9 g/dL (ref 30.0–36.0)
MCV: 89 fL (ref 80.0–100.0)
Platelets: 318 10*3/uL (ref 150–400)
RBC: 4.64 MIL/uL (ref 3.87–5.11)
RDW: 13.3 % (ref 11.5–15.5)
WBC: 10.2 10*3/uL (ref 4.0–10.5)
nRBC: 0 % (ref 0.0–0.2)

## 2021-04-14 LAB — COMPREHENSIVE METABOLIC PANEL
ALT: 50 U/L — ABNORMAL HIGH (ref 0–44)
AST: 42 U/L — ABNORMAL HIGH (ref 15–41)
Albumin: 4.5 g/dL (ref 3.5–5.0)
Alkaline Phosphatase: 86 U/L (ref 38–126)
Anion gap: 8 (ref 5–15)
BUN: 14 mg/dL (ref 6–20)
CO2: 24 mmol/L (ref 22–32)
Calcium: 8.8 mg/dL — ABNORMAL LOW (ref 8.9–10.3)
Chloride: 108 mmol/L (ref 98–111)
Creatinine, Ser: 0.66 mg/dL (ref 0.44–1.00)
GFR, Estimated: >60 mL/min (ref >60)
Glucose, Bld: 106 mg/dL — ABNORMAL HIGH (ref 70–99)
Potassium: 3.7 mmol/L (ref 3.5–5.1)
Sodium: 140 mmol/L (ref 135–145)
Total Bilirubin: 0.4 mg/dL (ref 0.3–1.2)
Total Protein: 8.3 g/dL — ABNORMAL HIGH (ref 6.5–8.1)

## 2021-04-14 LAB — ACETAMINOPHEN LEVEL: Acetaminophen (Tylenol), Serum: <10 ug/mL — ABNORMAL LOW (ref 10–30)

## 2021-04-14 LAB — PREGNANCY, URINE: Preg Test, Ur: NEGATIVE

## 2021-04-14 LAB — RESP PANEL BY RT-PCR (FLU A&B, COVID) ARPGX2
Influenza A by PCR: NEGATIVE
Influenza B by PCR: NEGATIVE
SARS Coronavirus 2 by RT PCR: NEGATIVE

## 2021-04-14 LAB — ETHANOL: Alcohol, Ethyl (B): 252 mg/dL — ABNORMAL HIGH (ref <10)

## 2021-04-14 LAB — SALICYLATE LEVEL: Salicylate Lvl: <7 mg/dL — ABNORMAL LOW (ref 7.0–30.0)

## 2021-04-14 MED ORDER — HYDROXYZINE HCL 25 MG PO TABS: 25.0000 mg | ORAL_TABLET | Freq: Three times a day (TID) | ORAL | Status: DC | PRN

## 2021-04-14 MED ORDER — ADULT MULTIVITAMIN W/MINERALS CH
1.0000 | ORAL_TABLET | Freq: Every day | ORAL | Status: DC
  Administered 2021-04-14 – 2021-04-15 (×2): 1 via ORAL
  Filled 2021-04-14 (×2): qty 1

## 2021-04-14 MED ORDER — IOHEXOL 300 MG/ML  SOLN
100.0000 mL | Freq: Once | INTRAMUSCULAR | Status: AC | PRN
  Administered 2021-04-14: 100 mL via INTRAVENOUS

## 2021-04-14 MED ORDER — LORAZEPAM 2 MG/ML IJ SOLN: 1.0000 mg | INTRAMUSCULAR | Status: DC | PRN

## 2021-04-14 MED ORDER — IBUPROFEN 600 MG PO TABS
600.0000 mg | ORAL_TABLET | Freq: Once | ORAL | Status: AC
  Administered 2021-04-14: 600 mg via ORAL
  Filled 2021-04-14: qty 1

## 2021-04-14 MED ORDER — LORAZEPAM 1 MG PO TABS: 1.0000 mg | ORAL_TABLET | ORAL | Status: DC | PRN

## 2021-04-14 MED ORDER — THIAMINE HCL 100 MG/ML IJ SOLN: 100.0000 mg | Freq: Every day | INTRAMUSCULAR | Status: DC

## 2021-04-14 MED ORDER — NICOTINE 21 MG/24HR TD PT24
21.0000 mg | MEDICATED_PATCH | Freq: Once | TRANSDERMAL | Status: AC
  Administered 2021-04-14: 21 mg via TRANSDERMAL
  Filled 2021-04-14: qty 1

## 2021-04-14 MED ORDER — ACETAMINOPHEN 500 MG PO TABS
1000.0000 mg | ORAL_TABLET | Freq: Once | ORAL | Status: AC
  Administered 2021-04-14: 1000 mg via ORAL
  Filled 2021-04-14: qty 2

## 2021-04-14 MED ORDER — FOLIC ACID 1 MG PO TABS
1.0000 mg | ORAL_TABLET | Freq: Every day | ORAL | Status: DC
  Administered 2021-04-14 – 2021-04-15 (×2): 1 mg via ORAL
  Filled 2021-04-14 (×2): qty 1

## 2021-04-14 MED ORDER — THIAMINE HCL 100 MG PO TABS
100.0000 mg | ORAL_TABLET | Freq: Every day | ORAL | Status: DC
  Administered 2021-04-14 – 2021-04-15 (×2): 100 mg via ORAL
  Filled 2021-04-14 (×2): qty 1

## 2021-04-14 NOTE — ED Notes (Signed)
Patient complaining of pain on lower abdominal region. Patient has a lump the size of a small small tangerine. Patient states its painful with touched. EDP Dr. Larinda Buttery notified and assessed patient with writer in room.

## 2021-04-14 NOTE — ED Provider Notes (Signed)
----------------------------------------- °  4:44 PM on 04/14/2021 ----------------------------------------- Patient complaining of pain in the right lower quadrant of her abdomen, has swollen area that she states has been present for at least 6 months there although increasingly painful over the past couple of days.  Exam consistent with inguinal hernia versus lipoma that is tender and not reducible, we will further assess with CT scan to ensure no evidence of incarcerated hernia.  ----------------------------------------- 7:48 PM on 04/14/2021 ----------------------------------------- CT scan is remarkable for right inguinal hernia containing fat, which appears consistent with patient's area of pain.  There is no evidence of incarcerated or strangulated bowel and she may be medically cleared.  Patient was cleared by psychiatry and IVC rescinded, is pending further evaluation by [name] for potential detox placement.   Chesley Noon, MD 04/14/21 1949

## 2021-04-14 NOTE — ED Notes (Signed)
Patient provided snack at appropriate snack time.  Pt consumed 100% of snack provided, tolerated well w/o complaints   Trash disposted of appropriately by patient.  
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
## 2021-04-14 NOTE — ED Notes (Signed)
Meal tray given to patient.
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
## 2021-04-14 NOTE — ED Notes (Signed)
Lunch placed at bedside °

## 2021-04-14 NOTE — ED Notes (Signed)
PT  VOL  PENDING  D/C  IN THE  AM

## 2021-04-14 NOTE — ED Notes (Signed)
Cup of water given to patient. 

## 2021-04-14 NOTE — ED Triage Notes (Signed)
Pt coming in with police, she called them because she has not drank in 3 years and today she relapsed. She drank a 5th [employer] and did $20 of Coke. Pt is tearful. Denies any SI or HI
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
## 2021-04-14 NOTE — ED Notes (Signed)
Patient transported to CT 

## 2021-04-14 NOTE — ED Notes (Signed)
Pt states arm hurts due to IV. NT removed IV from pt arm.

## 2021-04-14 NOTE — ED Notes (Signed)
Gave food tray with water. °

## 2021-04-14 NOTE — ED Notes (Signed)
Psy provider and [name] at bedside.

## 2021-04-14 NOTE — ED Notes (Signed)
Snack and soft drink was given °

## 2021-04-14 NOTE — ED Notes (Signed)
IVC PAPERS  RESCINDED  INFORMED  KIM  RN

## 2021-04-14 NOTE — ED Notes (Signed)
Pt belongings left at nurse station, belongings to include: Jersey,Jacket, shoes, jeans, shirt, hair tie, bracelet (rubber)

## 2021-04-14 NOTE — ED Notes (Signed)
IVC PENDING  CONSULT ?

## 2021-04-15 MED ORDER — ACETAMINOPHEN 500 MG PO TABS
1000.0000 mg | ORAL_TABLET | Freq: Four times a day (QID) | ORAL | Status: DC | PRN
  Administered 2021-04-15: 1000 mg via ORAL
  Filled 2021-04-15: qty 2

## 2021-04-15 NOTE — ED Notes (Signed)
Pt given breakfast tray

## 2021-10-26 ENCOUNTER — Emergency Department: Payer: Self-pay

## 2021-10-26 ENCOUNTER — Other Ambulatory Visit
Admission: RE | Admit: 2021-10-26 | Discharge: 2021-10-26 | Disposition: A | Discharge status: Home / Self Care | Payer: Self-pay | Source: Ambulatory Visit | Attending: Student | Admitting: Student

## 2021-10-26 ENCOUNTER — Encounter: Payer: Self-pay | Admitting: Emergency Medicine

## 2021-10-26 ENCOUNTER — Other Ambulatory Visit: Payer: Self-pay

## 2021-10-26 ENCOUNTER — Emergency Department
Admission: EM | Admit: 2021-10-26 | Discharge: 2021-10-26 | Disposition: A | Discharge status: Home / Self Care | Payer: Self-pay | Attending: Emergency Medicine | Admitting: Emergency Medicine

## 2021-10-26 DIAGNOSIS — R42 Dizziness and giddiness: Secondary | ICD-10-CM | POA: Insufficient documentation

## 2021-10-26 DIAGNOSIS — J4521 Mild intermittent asthma with (acute) exacerbation: Secondary | ICD-10-CM | POA: Insufficient documentation

## 2021-10-26 DIAGNOSIS — R051 Acute cough: Secondary | ICD-10-CM | POA: Insufficient documentation

## 2021-10-26 DIAGNOSIS — R0602 Shortness of breath: Secondary | ICD-10-CM | POA: Insufficient documentation

## 2021-10-26 DIAGNOSIS — Z20822 Contact with and (suspected) exposure to covid-19: Secondary | ICD-10-CM | POA: Insufficient documentation

## 2021-10-26 LAB — CBC WITH DIFFERENTIAL/PLATELET
Abs Immature Granulocytes: 0.49 10*3/uL — ABNORMAL HIGH (ref 0.00–0.07)
Basophils Absolute: 0.1 10*3/uL (ref 0.0–0.1)
Basophils Relative: 1 %
Eosinophils Absolute: 0.2 10*3/uL (ref 0.0–0.5)
Eosinophils Relative: 1 %
HCT: 37.7 % (ref 36.0–46.0)
Hemoglobin: 12.5 g/dL (ref 12.0–15.0)
Immature Granulocytes: 3 %
Lymphocytes Relative: 17 %
Lymphs Abs: 2.6 10*3/uL (ref 0.7–4.0)
MCH: 30.1 pg (ref 26.0–34.0)
MCHC: 33.2 g/dL (ref 30.0–36.0)
MCV: 90.8 fL (ref 80.0–100.0)
Monocytes Absolute: 1 10*3/uL (ref 0.1–1.0)
Monocytes Relative: 7 %
Neutro Abs: 10.8 10*3/uL — ABNORMAL HIGH (ref 1.7–7.7)
Neutrophils Relative %: 71 %
Platelets: 425 10*3/uL — ABNORMAL HIGH (ref 150–400)
RBC: 4.15 MIL/uL (ref 3.87–5.11)
RDW: 12.3 % (ref 11.5–15.5)
WBC: 15.2 10*3/uL — ABNORMAL HIGH (ref 4.0–10.5)
nRBC: 0.1 % (ref 0.0–0.2)

## 2021-10-26 LAB — RESP PANEL BY RT-PCR (FLU A&B, COVID) ARPGX2
Influenza A by PCR: NEGATIVE
Influenza B by PCR: NEGATIVE
SARS Coronavirus 2 by RT PCR: NEGATIVE

## 2021-10-26 LAB — COMPREHENSIVE METABOLIC PANEL
ALT: 29 U/L (ref 0–44)
AST: 29 U/L (ref 15–41)
Albumin: 3.2 g/dL — ABNORMAL LOW (ref 3.5–5.0)
Alkaline Phosphatase: 76 U/L (ref 38–126)
Anion gap: 7 (ref 5–15)
BUN: 7 mg/dL (ref 6–20)
CO2: 31 mmol/L (ref 22–32)
Calcium: 8.8 mg/dL — ABNORMAL LOW (ref 8.9–10.3)
Chloride: 102 mmol/L (ref 98–111)
Creatinine, Ser: 0.54 mg/dL (ref 0.44–1.00)
GFR, Estimated: >60 mL/min (ref >60)
Glucose, Bld: 121 mg/dL — ABNORMAL HIGH (ref 70–99)
Potassium: 3.1 mmol/L — ABNORMAL LOW (ref 3.5–5.1)
Sodium: 140 mmol/L (ref 135–145)
Total Bilirubin: 0.3 mg/dL (ref 0.3–1.2)
Total Protein: 7.4 g/dL (ref 6.5–8.1)

## 2021-10-26 LAB — TROPONIN I (HIGH SENSITIVITY)
Troponin I (High Sensitivity): 6 ng/L (ref <18)
Troponin I (High Sensitivity): 6 ng/L (ref <18)

## 2021-10-26 LAB — D-DIMER, QUANTITATIVE: D-Dimer, Quant: 1.07 ug/mL-FEU — ABNORMAL HIGH (ref 0.00–0.50)

## 2021-10-26 MED ORDER — ALBUTEROL SULFATE (2.5 MG/3ML) 0.083% IN NEBU
2.5000 mg | INHALATION_SOLUTION | Freq: Once | RESPIRATORY_TRACT | Status: AC
  Administered 2021-10-26: 2.5 mg via RESPIRATORY_TRACT
  Filled 2021-10-26: qty 3

## 2021-10-26 MED ORDER — PREDNISONE 50 MG PO TABS: 50.0000 mg | ORAL_TABLET | Freq: Every day | ORAL | 0 refills | Status: DC

## 2021-10-26 MED ORDER — AZITHROMYCIN 250 MG PO TABS: ORAL_TABLET | ORAL | 0 refills | Status: DC

## 2021-10-26 MED ORDER — PREDNISONE 20 MG PO TABS
60.0000 mg | ORAL_TABLET | Freq: Once | ORAL | Status: AC
Start: 2021-10-26 — End: 2021-10-26
  Administered 2021-10-26: 60 mg via ORAL
  Filled 2021-10-26: qty 3

## 2021-10-26 MED ORDER — BENZONATATE 100 MG PO CAPS: 100.0000 mg | ORAL_CAPSULE | Freq: Three times a day (TID) | ORAL | 0 refills | Status: DC | PRN

## 2021-10-26 MED ORDER — PSEUDOEPH-BROMPHEN-DM 30-2-10 MG/5ML PO SYRP: 10.0000 mL | ORAL_SOLUTION | Freq: Four times a day (QID) | ORAL | 0 refills | Status: DC | PRN

## 2021-10-26 MED ORDER — ALBUTEROL SULFATE HFA 108 (90 BASE) MCG/ACT IN AERS: 2.0000 | INHALATION_SPRAY | RESPIRATORY_TRACT | 0 refills | Status: DC | PRN

## 2021-10-26 NOTE — ED Provider Triage Note (Signed)
Emergency Medicine Provider Triage Evaluation Note  Molly Greer , a 61 y.o. female  was evaluated in triage.  Pt complains of shortness of breath, fever, congestion, chest tightness.  Patient has a history of asthma not have any medications at home including rescue inhaler.  Symptoms for 8 days..  Review of Systems  Positive: Fever, congestion, cough, shortness of breath, chest tightness Negative: Emesis, diarrhea, urinary complaints  Physical Exam  BP 137/70 (BP Location: Right Arm)   Pulse 89   Temp 98.1 F (36.7 C) (Oral)   Resp 18   Ht 5\' 2"  (1.575 m)   Wt 63.5 kg   SpO2 97%   BMI 25.61 kg/m  Gen:   Awake, no distress   Resp:  Normal effort  MSK:   Moves extremities without difficulty  Other:    Medical Decision Making  Medically screening exam initiated at 4:56 PM.  Appropriate orders placed.  Molly Greer was informed that the remainder of the evaluation will be completed by another provider, this initial triage assessment does not replace that evaluation, and the importance of remaining in the ED until their evaluation is complete.  Patient will have labs, EKG, chest x-ray   Molly Saba, PA-C 10/26/21 1656

## 2021-10-26 NOTE — ED Triage Notes (Signed)
Patient to ED via POV from [facility] for SOB. Pt states SOB as increased over the past few days and has been under a lot of stress. Hx asthma

## 2021-10-26 NOTE — ED Notes (Signed)
Pt to ED for worsening SOB that started about 7 days ago, pt tachypneic upon assessment. Pt states she has CP while talking.  Pt has hx of Asthma

## 2021-10-26 NOTE — ED Provider Notes (Signed)
p  [facility] Provider Note  Patient Contact: 9:50 PM (approximate)   History   Shortness of Breath   HPI  Molly Greer is a 61 y.o. female who presents the emergency department complaining of cough, feeling like she was wheezing, chest tightness.  I saw the patient in triage as well and placed orders there.  Patient has had 8 to 9 days of symptoms, starting after she came [place] from an [place] trip.  Has not some fevers though none today.  Patient has no frank difficulty breathing.  No substernal chest pain just feels that her chest is tight.  She does not have a rescue inhaler and does not take any medications for same.  Patient is also endorsing some anxiety about a court date that involves her grandkids and feels that this also may be increasing her respiratory complaints.  No GI symptoms.  No urinary symptoms.     Physical Exam   Triage Vital Signs: ED Triage Vitals  Enc Vitals Group     BP 10/26/21 1606 137/70     Pulse Rate 10/26/21 1606 89     Resp 10/26/21 1606 18     Temp 10/26/21 1653 98.1 F (36.7 C)     Temp Source 10/26/21 1653 Oral     SpO2 10/26/21 1606 97 %     Weight 10/26/21 1653 140 lb (63.5 kg)     Height 10/26/21 1653 5\' 2"  (1.575 m)     Head Circumference --      Peak Flow --      Pain Score 10/26/21 1652 0     Pain Loc --      Pain Edu? --      Excl. in GC? --     Most recent vital signs: Vitals:   10/26/21 1653 10/26/21 2000  BP:  137/85  Pulse:  82  Resp:  18  Temp: 98.1 F (36.7 C) 99.8 F (37.7 C)  SpO2:  95%     General: Alert and in no acute distress. ENT:      Ears:       Nose: No congestion/rhinnorhea.      Mouth/Throat: Mucous membranes are moist. Neck: No stridor. No cervical spine tenderness to palpation. Hematological/Lymphatic/Immunilogical: No cervical lymphadenopathy. Cardiovascular:  Good peripheral perfusion Respiratory: Normal respiratory effort without tachypnea or retractions. Lungs  with very faint expiratory wheezes in the bilateral lower lung fields. Good air entry to the bases with no decreased or absent breath sounds. Musculoskeletal: Full range of motion to all extremities.  Neurologic:  No gross focal neurologic deficits are appreciated.  Skin:   No rash noted Other:   ED Results / Procedures / Treatments   Labs (all labs ordered are listed, but only abnormal results are displayed) Labs Reviewed  COMPREHENSIVE METABOLIC PANEL - Abnormal; Notable for the following components:      Result Value   Potassium 3.1 (*)    Glucose, Bld 121 (*)    Calcium 8.8 (*)    Albumin 3.2 (*)    All other components within normal limits  CBC WITH DIFFERENTIAL/PLATELET - Abnormal; Notable for the following components:   WBC 15.2 (*)    Platelets 425 (*)    Neutro Abs 10.8 (*)    Abs Immature Granulocytes 0.49 (*)    All other components within normal limits  RESP PANEL BY RT-PCR (FLU A&B, COVID) [id number]  TROPONIN I (HIGH SENSITIVITY)  TROPONIN I (HIGH SENSITIVITY)  EKG     RADIOLOGY  I personally viewed, evaluated, and interpreted these images as part of my medical decision making, as well as reviewing the written report by the radiologist.  ED Provider Interpretation: Mild chronic bilateral interstitial thickening but no evidence of infiltrate or consolidation  DG Chest 2 View  Result Date: 10/26/2021 CLINICAL DATA:  Shortness of breath.  History of asthma. EXAM: CHEST - 2 VIEW COMPARISON:  AP chest 01/31/2020, 09/22/2018; chest two views 10/03/2016; CT chest 01/31/2020 FINDINGS: Cardiac silhouette and mediastinal contours are within normal limits. Mild calcification within aortic arch. Mild chronic bilateral interstitial thickening. Curvilinear left lateral lower lung subsegmental atelectasis versus scarring is new from prior 01/31/2020 radiograph but appears similar to curvilinear scarring in this region on 01/31/2020 CT. No pleural effusion is seen on lateral  view. There is mild thickening of the right costophrenic angle which is similar to 01/31/2020 and may be secondary to scarring. No pneumothorax. Old healed posterolateral right seventh rib fracture. IMPRESSION: Curvilinear left basilar likely mild scarring is similar to 01/31/2020 CT. Mild thickening of the right costophrenic angle is similar to prior radiograph and CT and may represent scarring. This is largely unchanged compared to 10/03/2016 remote radiographs. Electronically Signed   By: Neita Garnet M.D.   On: 10/26/2021 17:35    PROCEDURES:  Critical Care performed: No  Procedures   MEDICATIONS ORDERED IN ED: Medications  predniSONE (DELTASONE) tablet 60 mg (60 mg Oral Given 10/26/21 2156)  albuterol (PROVENTIL) (2.5 MG/3ML) 0.083% nebulizer solution 2.5 mg (2.5 mg Nebulization Given 10/26/21 2157)     IMPRESSION / MDM / ASSESSMENT AND PLAN / ED COURSE  I reviewed the triage vital signs and the nursing notes.                              Differential diagnosis includes, but is not limited to, asthma exacerbation, viral illness, pneumonia, anxiety   Patient's presentation is most consistent with acute presentation with potential threat to life or bodily function.   Patient's diagnosis is consistent with viral illness with asthma exacerbation.  Patient presents to the ED with viral URI symptoms with increasing cough and some reported wheezing.  Patient had very minimal expiratory wheezing in the lower lung fields.  Does have a history of asthma but does not have a rescue inhaler.  Overall work-up is reassuring.  I do suspect a viral component that exacerbated the patient's asthma.  She received albuterol and prednisone in the emergency department.  I will treat the patient with prednisone, albuterol, cough medications at home.  Given the fact that this is 9 days with a history of asthma I will also treat for community-acquired pneumonia but there is no findings on chest x-ray or physical  exam at this time concerning for pneumonia.  Follow-up primary care.  Return precautions discussed with the patient.. Patient is given ED precautions to return to the ED for any worsening or new symptoms.        FINAL CLINICAL IMPRESSION(S) / ED DIAGNOSES   Final diagnoses:  Mild intermittent asthma with exacerbation     Rx / DC Orders   ED Discharge Orders          Ordered    albuterol (VENTOLIN HFA) 108 (90 Base) MCG/ACT inhaler  Every 4 hours PRN        10/26/21 2200    predniSONE (DELTASONE) 50 MG tablet  Daily with breakfast  10/26/21 2200    brompheniramine-pseudoephedrine-DM 30-2-10 MG/5ML syrup  4 times daily PRN        10/26/21 2200    benzonatate (TESSALON PERLES) 100 MG capsule  3 times daily PRN        10/26/21 2200    azithromycin (ZITHROMAX Z-PAK) 250 MG tablet        10/26/21 2200             Note:  This document was prepared using Dragon voice recognition software and may include unintentional dictation errors.   Lanette Hampshire 10/26/21 2220    Georga Hacking, MD 10/26/21 2322

## 2021-12-26 ENCOUNTER — Emergency Department
Admission: EM | Admit: 2021-12-26 | Discharge: 2021-12-26 | Disposition: A | Discharge status: Home / Self Care | Payer: Self-pay | Attending: Emergency Medicine | Admitting: Emergency Medicine

## 2021-12-26 ENCOUNTER — Other Ambulatory Visit: Payer: Self-pay

## 2021-12-26 DIAGNOSIS — F14150 Cocaine abuse with cocaine-induced psychotic disorder with delusions: Secondary | ICD-10-CM

## 2021-12-26 DIAGNOSIS — R21 Rash and other nonspecific skin eruption: Secondary | ICD-10-CM | POA: Insufficient documentation

## 2021-12-26 DIAGNOSIS — Z79899 Other long term (current) drug therapy: Secondary | ICD-10-CM | POA: Insufficient documentation

## 2021-12-26 LAB — COMPREHENSIVE METABOLIC PANEL
ALT: 30 U/L (ref 0–44)
AST: 36 U/L (ref 15–41)
Albumin: 4.2 g/dL (ref 3.5–5.0)
Alkaline Phosphatase: 89 U/L (ref 38–126)
Anion gap: 9 (ref 5–15)
BUN: 23 mg/dL — ABNORMAL HIGH (ref 6–20)
CO2: 23 mmol/L (ref 22–32)
Calcium: 9.1 mg/dL (ref 8.9–10.3)
Chloride: 108 mmol/L (ref 98–111)
Creatinine, Ser: 0.62 mg/dL (ref 0.44–1.00)
GFR, Estimated: >60 mL/min (ref >60)
Glucose, Bld: 110 mg/dL — ABNORMAL HIGH (ref 70–99)
Potassium: 3.4 mmol/L — ABNORMAL LOW (ref 3.5–5.1)
Sodium: 140 mmol/L (ref 135–145)
Total Bilirubin: 1.1 mg/dL (ref 0.3–1.2)
Total Protein: 7.6 g/dL (ref 6.5–8.1)

## 2021-12-26 LAB — URINE DRUG SCREEN, QUALITATIVE (ARMC ONLY)
Amphetamines, Ur Screen: NOT DETECTED
Barbiturates, Ur Screen: NOT DETECTED
Benzodiazepine, Ur Scrn: NOT DETECTED
Cannabinoid 50 Ng, Ur ~~LOC~~: NOT DETECTED
Cocaine Metabolite,Ur ~~LOC~~: POSITIVE — AB
MDMA (Ecstasy)Ur Screen: NOT DETECTED
Methadone Scn, Ur: NOT DETECTED
Opiate, Ur Screen: NOT DETECTED
Phencyclidine (PCP) Ur S: NOT DETECTED
Tricyclic, Ur Screen: NOT DETECTED

## 2021-12-26 LAB — CBC
HCT: 36.9 % (ref 36.0–46.0)
Hemoglobin: 12.4 g/dL (ref 12.0–15.0)
MCH: 30.3 pg (ref 26.0–34.0)
MCHC: 33.6 g/dL (ref 30.0–36.0)
MCV: 90.2 fL (ref 80.0–100.0)
Platelets: 276 10*3/uL (ref 150–400)
RBC: 4.09 MIL/uL (ref 3.87–5.11)
RDW: 14 % (ref 11.5–15.5)
WBC: 8.2 10*3/uL (ref 4.0–10.5)
nRBC: 0 % (ref 0.0–0.2)

## 2021-12-26 LAB — SALICYLATE LEVEL: Salicylate Lvl: <7 mg/dL — ABNORMAL LOW (ref 7.0–30.0)

## 2021-12-26 LAB — POC URINE PREG, ED: Preg Test, Ur: NEGATIVE

## 2021-12-26 LAB — ETHANOL: Alcohol, Ethyl (B): <10 mg/dL (ref <10)

## 2021-12-26 LAB — ACETAMINOPHEN LEVEL: Acetaminophen (Tylenol), Serum: <10 ug/mL — ABNORMAL LOW (ref 10–30)

## 2021-12-26 MED ORDER — DIPHENHYDRAMINE HCL 50 MG/ML IJ SOLN
25.0000 mg | Freq: Once | INTRAMUSCULAR | Status: AC
  Administered 2021-12-26: 25 mg via INTRAVENOUS
  Filled 2021-12-26: qty 1

## 2021-12-26 MED ORDER — GABAPENTIN 300 MG PO CAPS: 300.0000 mg | ORAL_CAPSULE | Freq: Three times a day (TID) | ORAL | 0 refills | Status: DC

## 2021-12-26 MED ORDER — LORAZEPAM 2 MG/ML IJ SOLN
2.0000 mg | Freq: Once | INTRAMUSCULAR | Status: AC
  Administered 2021-12-26: 2 mg via INTRAVENOUS
  Filled 2021-12-26: qty 1

## 2021-12-26 MED ORDER — GABAPENTIN 300 MG PO CAPS
300.0000 mg | ORAL_CAPSULE | Freq: Three times a day (TID) | ORAL | Status: DC
  Administered 2021-12-26: 300 mg via ORAL
  Filled 2021-12-26: qty 1

## 2021-12-26 NOTE — ED Notes (Signed)
Pending rescinding of IVC pt may just stay in same room, sleep then discharge home. Will reach out to psych NP who saw pt.

## 2021-12-26 NOTE — ED Triage Notes (Signed)
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

## 2021-12-26 NOTE — ED Notes (Signed)
NP signed paperwork to have IVC rescinded. Pt sleeping at this time.

## 2021-12-26 NOTE — Discharge Instructions (Addendum)
The gabapentin as prescribed as this may help with your itching and other symptoms.  Avoid cocaine use.  Follow-up at [facility].  Return to the ER for any new or worsening symptoms including worsening itching or rash, worsening anxiety, any thoughts of wanting to harm yourself or anyone else, or any other new or worsening symptoms that concern you.

## 2021-12-26 NOTE — ED Notes (Signed)
Pt spoke again with EDP. Pt on phone with daughter for ride.

## 2021-12-26 NOTE — ED Notes (Signed)
Pt dressed out. Pt became agitated with undressing. Pt now moaning and shaking limbs. Attempted to calm and reassure pt.   Pt belongings include (2 bags):  Green underwear Red bra Romie Minus shorts [place] long sleeve shity Teal nylon bag Disney mini backpack, blue Black mini backpack Black flip flops

## 2021-12-26 NOTE — ED Notes (Signed)
Provided pt belongings back to pt:  Green underwear Red bra Romie Minus shorts [place] long sleeve shity Teal nylon bag Disney mini backpack, blue Black mini backpack Black flip flops  Pt wishes to show EDP results of her online research regarding her belief in parasites in her body. EDP will speak again with pt.

## 2021-12-26 NOTE — ED Notes (Signed)
Pt removed all monitoring leads. Pt is much calmer after sleeping. Pt getting dressed in own clothes.

## 2021-12-26 NOTE — ED Notes (Signed)
Pt resting in bed with lights dimmed.

## 2021-12-26 NOTE — ED Notes (Signed)
Pt refuses repeat V/S
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
## 2021-12-26 NOTE — ED Notes (Signed)
IVC papers rescinded per NP Reita Cliche

## 2021-12-26 NOTE — ED Notes (Signed)
EDP at bedside  

## 2021-12-26 NOTE — Consult Note (Signed)
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 female patient admitted with delusions of parasites.  HPI:  61 yo female presented to the ED with leg pain as she thought there were parasites in her leg.  Cooperative on assessment, calmed after Ativan as she was anxious.  She used an ounce or two of cocaine and started having delusions there were parasites in her leg.  On assessment, she minimizes her cocaine abuse and complains of leg discomfort.  "Cocaine calms me."  Prior to assessment, she was calmly resting in bed.  She denies suicidal/homicidal ideations, hallucinations.  Declines rehab or detox, psych cleared.  Past Psychiatric History: stimulant abuse  Risk to Self:  none Risk to Others:  none Prior Inpatient Therapy:  yes Prior Outpatient Therapy:  none  Past Medical History:  Past Medical History:  Diagnosis Date   Anxiety    Asthma    Bone spur    right foot   Hypertension    MRSA infection    Stroke [facility](HCC)     Past Surgical History:  Procedure Laterality Date   ROTATOR CUFF REPAIR     TUBAL LIGATION     Family History: History reviewed. No pertinent family history. Family Psychiatric  History: none Social History:  Social History   Substance and Sexual Activity  Alcohol Use Yes   Comment: 2 shots liquor each night per pt     Social History   Substance and Sexual Activity  Drug Use Yes   Types: Cocaine   Comment: states not now, states no meth    Social History   Socioeconomic History   Marital status: Single    Spouse name: Not on file    Number of children: Not on file   Years of education: Not on file   Highest education level: Not on file  Occupational History   Not on file  Tobacco Use   Smoking status: Every Day    Packs/day: 0.50    Types: Cigarettes   Smokeless tobacco: Never  Substance and Sexual Activity   Alcohol use: Yes    Comment: 2 shots liquor each night per pt   Drug use: Yes    Types: Cocaine    Comment: states not now, states no meth   Sexual activity: Not Currently  Other Topics Concern   Not on file  Social History Narrative   Not on file   Social Determinants of Health   Financial Resource Strain: Not on file  Food Insecurity: Not on file  Transportation Needs: Not on file  Physical Activity: Not on file  Stress: Not on file  Social Connections: Not on file   Additional Social History:    Allergies:   Allergies  Allergen Reactions   Penicillins Swelling    Labs:  Results for orders placed or performed during the hospital encounter of 12/26/21 (from the past 48 hour(s))  Comprehensive metabolic panel     Status: Abnormal   Collection Time: 12/26/21  7:30 AM  Result Value Ref Range   Sodium 140 135 - 145 mmol/L   Potassium 3.4 (L) 3.5 - 5.1 mmol/L  Chloride 108 98 - 111 mmol/L   CO2 23 22 - 32 mmol/L   Glucose, Bld 110 (H) 70 - 99 mg/dL    Comment: Glucose reference range applies only to samples taken after fasting for at least 8 hours.   BUN 23 (H) 6 - 20 mg/dL   Creatinine, Ser 0.08 0.44 - 1.00 mg/dL   Calcium 9.1 8.9 - 67.6 mg/dL   Total Protein 7.6 6.5 - 8.1 g/dL   Albumin 4.2 3.5 - 5.0 g/dL   AST 36 15 - 41 U/L   ALT 30 0 - 44 U/L   Alkaline Phosphatase 89 38 - 126 U/L   Total Bilirubin 1.1 0.3 - 1.2 mg/dL   GFR, Estimated >19 >50 mL/min    Comment: (NOTE) Calculated using the CKD-EPI Creatinine Equation (2021)    Anion gap 9 5 - 15    Comment: Performed at [facility], [address] [place], [place] [address]  Ethanol     Status: None    Collection Time: 12/26/21  7:30 AM  Result Value Ref Range   Alcohol, Ethyl (B) <10 <10 mg/dL    Comment: (NOTE) Lowest detectable limit for serum alcohol is 10 mg/dL.  For medical purposes only. Performed at [facility], [address] [place], [place] [address]   cbc     Status: None   Collection Time: 12/26/21  7:30 AM  Result Value Ref Range   WBC 8.2 4.0 - 10.5 K/uL   RBC 4.09 3.87 - 5.11 MIL/uL   Hemoglobin 12.4 12.0 - 15.0 g/dL   HCT 09.9 83.3 - 82.5 %   MCV 90.2 80.0 - 100.0 fL   MCH 30.3 26.0 - 34.0 pg   MCHC 33.6 30.0 - 36.0 g/dL   RDW 05.3 97.6 - 73.4 %   Platelets 276 150 - 400 K/uL   nRBC 0.0 0.0 - 0.2 %    Comment: Performed at [facility], [address], [place], [place] [address]  Urine Drug Screen, Qualitative     Status: Abnormal   Collection Time: 12/26/21  7:30 AM  Result Value Ref Range   Tricyclic, Ur Screen NONE DETECTED NONE DETECTED   Amphetamines, Ur Screen NONE DETECTED NONE DETECTED   MDMA (Ecstasy)Ur Screen NONE DETECTED NONE DETECTED   Cocaine Metabolite,Ur [place] POSITIVE (A) NONE DETECTED   Opiate, Ur Screen NONE DETECTED NONE DETECTED   Phencyclidine (PCP) Ur S NONE DETECTED NONE DETECTED   Cannabinoid 50 Ng, Ur [place] NONE DETECTED NONE DETECTED   Barbiturates, Ur Screen NONE DETECTED NONE DETECTED   Benzodiazepine, Ur Scrn NONE DETECTED NONE DETECTED   Methadone Scn, Ur NONE DETECTED NONE DETECTED    Comment: (NOTE) Tricyclics + metabolites, urine    Cutoff 1000 ng/mL Amphetamines + metabolites, urine  Cutoff 1000 ng/mL MDMA (Ecstasy), urine              Cutoff 500 ng/mL Cocaine Metabolite, urine          Cutoff 300 ng/mL Opiate + metabolites, urine        Cutoff 300 ng/mL Phencyclidine (PCP), urine         Cutoff 25 ng/mL Cannabinoid, urine                 Cutoff 50 ng/mL Barbiturates + metabolites, urine  Cutoff 200 ng/mL Benzodiazepine, urine              Cutoff 200 ng/mL Methadone, urine  Cutoff 300  ng/mL  The urine drug screen provides only a preliminary, unconfirmed analytical test result and should not be used for non-medical purposes. Clinical consideration and professional judgment should be applied to any positive drug screen result due to possible interfering substances. A more specific alternate chemical method must be used in order to obtain a confirmed analytical result. Gas chromatography / mass spectrometry (GC/MS) is the preferred confirm atory method. Performed at [facility], [address] [place], [place] [address]   Acetaminophen level     Status: Abnormal   Collection Time: 12/26/21  7:30 AM  Result Value Ref Range   Acetaminophen (Tylenol), Serum <10 (L) 10 - 30 ug/mL    Comment: (NOTE) Therapeutic concentrations vary significantly. A range of 10-30 ug/mL  may be an effective concentration for many patients. However, some  are best treated at concentrations outside of this range. Acetaminophen concentrations >150 ug/mL at 4 hours after ingestion  and >50 ug/mL at 12 hours after ingestion are often associated with  toxic reactions.  Performed at [facility], [address] [place], [place] [address]   Salicylate level     Status: Abnormal   Collection Time: 12/26/21  7:30 AM  Result Value Ref Range   Salicylate Lvl <7.0 (L) 7.0 - 30.0 mg/dL    Comment: Performed at [facility], [address] [place], [place] [address]  POC urine preg, ED     Status: None   Collection Time: 12/26/21  7:52 AM  Result Value Ref Range   Preg Test, Ur NEGATIVE NEGATIVE    Comment:        THE SENSITIVITY OF THIS METHODOLOGY IS >24 mIU/mL     Current Facility-Administered Medications  Medication Dose Route Frequency Provider Last Rate Last Admin   gabapentin (NEURONTIN) capsule 300 mg  300 mg Oral TID Charm Rings, NP   300 mg at 12/26/21 1044   Current Outpatient Medications  Medication Sig Dispense Refill   albuterol (VENTOLIN  HFA) 108 (90 Base) MCG/ACT inhaler Inhale 2 puffs into the lungs every 4 (four) hours as needed for wheezing or shortness of breath. (Patient not taking: Reported on 12/26/2021) 1 each 0   ALPRAZolam (XANAX) 0.5 MG tablet Take 0.5 mg by mouth at bedtime as needed for anxiety. (Patient not taking: Reported on 04/15/2021)     azithromycin (ZITHROMAX Z-PAK) 250 MG tablet Take 2 tablets (500 mg) on  Day 1,  followed by 1 tablet (250 mg) once daily on Days 2 through 5. (Patient not taking: Reported on 12/26/2021) 6 each 0   benzonatate (TESSALON PERLES) 100 MG capsule Take 1 capsule (100 mg total) by mouth 3 (three) times daily as needed for cough. (Patient not taking: Reported on 12/26/2021) 30 capsule 0   brompheniramine-pseudoephedrine-DM 30-2-10 MG/5ML syrup Take 10 mLs by mouth 4 (four) times daily as needed. (Patient not taking: Reported on 12/26/2021) 200 mL 0   escitalopram (LEXAPRO) 10 MG tablet Take 1 tablet (10 mg total) by mouth daily. (Patient not taking: Reported on 04/15/2021) 30 tablet 2   famotidine (PEPCID) 20 MG tablet Take 1 tablet (20 mg total) by mouth 2 (two) times daily. (Patient not taking: Reported on 04/15/2021) 60 tablet 0   gabapentin (NEURONTIN) 100 MG capsule Take 1 capsule (100 mg total) by mouth 3 (three) times daily. (Patient not taking: Reported on 04/15/2021) 90 capsule 0   HYDROcodone-acetaminophen (NORCO) 5-325 MG tablet Take 1 tablet by mouth every 4 (four)  hours as needed for moderate pain. (Patient not taking: Reported on 04/14/2021) 6 tablet 0   ibuprofen (ADVIL,MOTRIN) 800 MG tablet Take 800 mg by mouth every 8 (eight) hours as needed. (Patient not taking: Reported on 04/15/2021)     levofloxacin (LEVAQUIN) 750 MG tablet Take 1 tablet (750 mg total) by mouth daily. (Patient not taking: Reported on 04/14/2021) 7 tablet 0   oxyCODONE-acetaminophen (ROXICET) 5-325 MG tablet Take 1-2 tablets by mouth every 4 (four) hours as needed for severe pain. (Patient not taking: Reported on  04/14/2021) 15 tablet 0   predniSONE (DELTASONE) 50 MG tablet Take 1 tablet (50 mg total) by mouth daily with breakfast. (Patient not taking: Reported on 12/26/2021) 5 tablet 0   promethazine (PHENERGAN) 12.5 MG tablet Take 1 tablet (12.5 mg total) by mouth every 6 (six) hours as needed. (Patient not taking: Reported on 04/14/2021) 12 tablet 0   traMADol (ULTRAM) 50 MG tablet Take by mouth every 6 (six) hours as needed. (Patient not taking: Reported on 04/15/2021)      Musculoskeletal: Strength & Muscle Tone: decreased Gait & Station: normal Patient leans: N/A  Psychiatric Specialty Exam: Physical Exam Vitals and nursing note reviewed.  Constitutional:      Appearance: Normal appearance.  HENT:     Head: Normocephalic.     Nose: Nose normal.  Pulmonary:     Effort: Pulmonary effort is normal.  Musculoskeletal:        General: Normal range of motion.     Cervical back: Normal range of motion.  Neurological:     General: No focal deficit present.     Mental Status: She is alert and oriented to person, place, and time.  Psychiatric:        Attention and Perception: Attention and perception normal.        Mood and Affect: Mood is anxious. Affect is blunt.        Speech: Speech normal.        Behavior: Behavior normal. Behavior is cooperative.        Thought Content: Thought content normal.        Cognition and Memory: Cognition and memory normal.        Judgment: Judgment normal.     Review of Systems  Psychiatric/Behavioral:  Positive for substance abuse. The patient is nervous/anxious.   All other systems reviewed and are negative.   Blood pressure 101/64, pulse 86, temperature 98.4 F (36.9 C), temperature source Oral, resp. rate 17, height 5\' 3"  (1.6 m), weight 63.5 kg, SpO2 96 %.Body mass index is 24.8 kg/m.  General Appearance: Disheveled  Eye Contact:  Good  Speech:  Normal Rate  Volume:  Normal  Mood:  Anxious  Affect:  Congruent  Thought Process:  Coherent   Orientation:  Full (Time, Place, and Person)  Thought Content:  Delusions  Suicidal Thoughts:  No  Homicidal Thoughts:  No  Memory:  Immediate;   Fair Recent;   Fair Remote;   Fair  Judgement:  Fair  Insight:  Lacking  Psychomotor Activity:  Decreased  Concentration:  Concentration: Fair and Attention Span: Fair  Recall:  of Knowledge:  Fair  Language:  Good  Akathisia:  No  Handed:  Right  AIMS (if indicated):     Assets:  Leisure Time Physical Health Resilience  ADL's:  Intact  Cognition:  WNL  Sleep:        Physical Exam: Physical Exam Vitals and nursing note reviewed.  Constitutional:      Appearance: Normal appearance.  HENT:     Head: Normocephalic.     Nose: Nose normal.  Pulmonary:     Effort: Pulmonary effort is normal.  Musculoskeletal:        General: Normal range of motion.     Cervical back: Normal range of motion.  Neurological:     General: No focal deficit present.     Mental Status: She is alert and oriented to person, place, and time.  Psychiatric:        Attention and Perception: Attention and perception normal.        Mood and Affect: Mood is anxious. Affect is blunt.        Speech: Speech normal.        Behavior: Behavior normal. Behavior is cooperative.        Thought Content: Thought content normal.        Cognition and Memory: Cognition and memory normal.        Judgment: Judgment normal.    Review of Systems  Psychiatric/Behavioral:  Positive for substance abuse. The patient is nervous/anxious.   All other systems reviewed and are negative.  Blood pressure (!) 107/57, pulse 85, temperature 98.4 F (36.9 C), temperature source Oral, resp. rate 18, height 5\' 3"  (1.6 m), weight 63.5 kg, SpO2 91 %. Body mass index is 24.8 kg/m.  Treatment Plan Summary: Cocaine abuse with cocaine induced mood disorder with delusions: Gabapentin 300 mg TID [facility] referral  Disposition: No evidence of imminent risk to self or others at  present.   Patient does not meet criteria for psychiatric inpatient admission.  Waylan Boga, NP 12/26/2021 11:02 AM

## 2021-12-26 NOTE — ED Notes (Signed)
Pt attempting to urinate on toilet. Pt continues to talk nonstop.

## 2021-12-29 ENCOUNTER — Emergency Department
Admission: EM | Admit: 2021-12-29 | Discharge: 2021-12-29 | Disposition: A | Discharge status: Home / Self Care | Payer: Self-pay | Attending: Emergency Medicine | Admitting: Emergency Medicine

## 2021-12-29 ENCOUNTER — Emergency Department: Payer: Self-pay

## 2021-12-29 ENCOUNTER — Emergency Department
Admission: EM | Admit: 2021-12-29 | Discharge: 2021-12-29 | Discharge status: Against Medical Advice | Payer: Self-pay | Attending: Student in an Organized Health Care Education/Training Program | Admitting: Student in an Organized Health Care Education/Training Program

## 2021-12-29 ENCOUNTER — Other Ambulatory Visit: Payer: Self-pay

## 2021-12-29 DIAGNOSIS — W010XXA Fall on same level from slipping, tripping and stumbling without subsequent striking against object, initial encounter: Secondary | ICD-10-CM | POA: Insufficient documentation

## 2021-12-29 DIAGNOSIS — Y9301 Activity, walking, marching and hiking: Secondary | ICD-10-CM | POA: Insufficient documentation

## 2021-12-29 DIAGNOSIS — M25531 Pain in right wrist: Secondary | ICD-10-CM | POA: Insufficient documentation

## 2021-12-29 DIAGNOSIS — Z046 Encounter for general psychiatric examination, requested by authority: Secondary | ICD-10-CM | POA: Insufficient documentation

## 2021-12-29 DIAGNOSIS — W19XXXA Unspecified fall, initial encounter: Secondary | ICD-10-CM | POA: Insufficient documentation

## 2021-12-29 DIAGNOSIS — Z5321 Procedure and treatment not carried out due to patient leaving prior to being seen by health care provider: Secondary | ICD-10-CM | POA: Insufficient documentation

## 2021-12-29 DIAGNOSIS — S52531A Colles' fracture of right radius, initial encounter for closed fracture: Secondary | ICD-10-CM | POA: Insufficient documentation

## 2021-12-29 NOTE — ED Provider Notes (Signed)
[facility] Emergency Department Provider Note     Event Date/Time   First MD Initiated Contact with Patient 12/29/21 2155     (approximate)   History   Wrist Pain   HPI  Molly Minnifield is a 61 y.o. female presents to the ED for evaluation of wrist pain.  Patient reports she slipped and fell hurting her right wrist.  She presents with a homemade fashion splint using credit cards to the volar and dorsal aspects of the wrist wrapped in place with a sock.  Patient does admit to cocaine use today, and also reports that she had at least 1 beer and a shot earlier she denies any SI, HI at this time.  She also denies any chest pain, shortness of breath, or other injuries at this time.  She does endorse some increased stressors in her personal life.     Physical Exam   Triage Vital Signs: ED Triage Vitals  Enc Vitals Group     BP 12/29/21 2118 (!) 168/86     Pulse Rate 12/29/21 2118 92     Resp 12/29/21 2118 20     Temp 12/29/21 2118 98.3 F (36.8 C)     Temp Source 12/29/21 2118 Oral     SpO2 12/29/21 2118 100 %     Weight 12/29/21 2119 130 lb (59 kg)     Height 12/29/21 2119 5\' 2"  (1.575 m)     Head Circumference --      Peak Flow --      Pain Score 12/29/21 2118 7     Pain Loc --      Pain Edu? --      Excl. in GC? --     Most recent vital signs: Vitals:   12/29/21 2118  BP: (!) 168/86  Pulse: 92  Resp: 20  Temp: 98.3 F (36.8 C)  SpO2: 100%    General Awake, no distress.  naD CV:  Good peripheral perfusion.  RESP:  Normal effort.  ABD:  No distention.  MSK:  RUE with STS to the dorsal hand and fingers. Tenderness to the volar wrist. Decreased pronation/supination on the right.    ED Results / Procedures / Treatments   Labs (all labs ordered are listed, but only abnormal results are displayed) Labs Reviewed - No data to display   EKG  RADIOLOGY  I personally viewed and evaluated these images as part of my medical decision  making, as well as reviewing the written report by the radiologist.  ED Provider Interpretation: distal radial and ulnar styloid process fractures  DG Wrist Complete Right  Result Date: 12/29/2021 CLINICAL DATA:  Status post fall. EXAM: RIGHT WRIST - COMPLETE 3+ VIEW COMPARISON:  None Available. FINDINGS: Acute, mildly impacted (approximately 5 mm) fracture deformity is seen involving the distal right radius. There is suspected extension to involve the radiocarpal articulation. An additional nondisplaced fracture is seen involving the right ulnar styloid. Mild to moderate severity degenerative changes are seen along the distal aspect of the right wrist. Mild diffuse soft tissue swelling is noted. IMPRESSION: Acute fractures of the distal right radius and right ulnar styloid. Electronically Signed   By: 12/31/2021 M.D.   On: 12/29/2021 21:48     PROCEDURES:  Critical Care performed: No  Procedures   MEDICATIONS ORDERED IN ED: Medications - No data to display   IMPRESSION / MDM / ASSESSMENT AND PLAN / ED COURSE  I reviewed the triage vital signs  and the nursing notes.                              Differential diagnosis includes, but is not limited to, wrist fracture, wrist sprain, wrist contusion, hand contusion  Patient's presentation is most consistent with acute complicated illness / injury requiring diagnostic workup.  Patient to the ED for evaluation of injury sustained following mechanical fall.  She denies any other injury today including head injury or LOC.  She is evaluated for complaints and found to have a distal radial fracture with ulnar styloid process fracture.  Normal distal pulses, cap refill, and nerve function.  She is placed in appropriate sugar-tong OCL splint, and given an arm immobilizer for comfort.  Patient's diagnosis is consistent with distal radial fracture. Patient will be discharged home with instructions to take OTC Tylenol for pain. Patient is to  follow up with orthopedics as needed or otherwise directed. Patient is given ED precautions to return to the ED for any worsening or new symptoms.     FINAL CLINICAL IMPRESSION(S) / ED DIAGNOSES   Final diagnoses:  Closed Colles' fracture of right radius, initial encounter     Rx / DC Orders   ED Discharge Orders     None        Note:  This document was prepared using Dragon voice recognition software and may include unintentional dictation errors.    Melvenia Needles, PA-C 12/29/21 2239    Delman Kitten, MD 12/29/21 [phone]

## 2021-12-29 NOTE — ED Notes (Signed)
Pt A&Ox4 ambulatory at d/c with independent steady gait. Pt verbalized understanding of d/c instructions and follow up care. 

## 2021-12-29 NOTE — Discharge Instructions (Addendum)
Keep the splint in place until you are evaluated by orthopedics.  You should call Dr. Laren Everts office tomorrow to schedule a follow-up appointment.  Take OTC Tylenol or Motrin as needed for pain relief.
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
## 2021-12-29 NOTE — ED Triage Notes (Addendum)
Pt presents to ED via POV for wrist pain Pt states she slipped and fell and hurt right wrist. Pt made homemade wrist splint. Pt admits to using cocaine today, drank a beer and a shot. Denies SI, HI, CP, SOB

## 2023-04-18 ENCOUNTER — Ambulatory Visit: Payer: Medicaid Other | Admitting: Cardiology

## 2023-04-20 ENCOUNTER — Ambulatory Visit: Payer: Medicaid Other | Admitting: Cardiology

## 2023-04-20 ENCOUNTER — Other Ambulatory Visit: Payer: Self-pay

## 2023-04-20 ENCOUNTER — Encounter: Payer: Self-pay | Admitting: Cardiology

## 2023-04-20 VITALS — BP 160/90 | HR 114 | Ht 62.0 in | Wt 162.2 lb

## 2023-04-20 DIAGNOSIS — Z131 Encounter for screening for diabetes mellitus: Secondary | ICD-10-CM | POA: Diagnosis not present

## 2023-04-20 DIAGNOSIS — H2630 Drug-induced cataract, unspecified eye: Secondary | ICD-10-CM

## 2023-04-20 DIAGNOSIS — F32A Depression, unspecified: Secondary | ICD-10-CM

## 2023-04-20 DIAGNOSIS — I10 Essential (primary) hypertension: Secondary | ICD-10-CM

## 2023-04-20 DIAGNOSIS — Z1329 Encounter for screening for other suspected endocrine disorder: Secondary | ICD-10-CM

## 2023-04-20 DIAGNOSIS — Z13220 Encounter for screening for lipoid disorders: Secondary | ICD-10-CM

## 2023-04-20 NOTE — Progress Notes (Signed)
New Patient Office Visit  Subjective   Patient ID: Molly Greer, female    DOB: 04/25/1960  Age: 63 y.o. MRN: [id number]  CC:  Chief Complaint  Patient presents with   Establish Care    NPE    HPI Molly Greer presents to establish care Previous Primary Care provider/office:   she does not have additional concerns to discuss today.   Patient in office to establish care. Patient very tearful in office today. Patient states she has a history of alcohol and drug abuse, currently clean. Patient states her daughter lost custody of her children, due to patient being on drugs, children were sent to different foster homes. Patient very upset feeling she could have prevented it. Will refer patient to psychiatry.  Patient states she has never had routine medical care. Will order fasting lab work today.  Patient reports being told she has a cataract. Will refer to ophthalmology.  Will discuss cancer screenings at next visit.     Outpatient Encounter Medications as of 04/20/2023  Medication Sig   albuterol (VENTOLIN HFA) 108 (90 Base) MCG/ACT inhaler Inhale 2 puffs into the lungs every 4 (four) hours as needed for wheezing or shortness of breath. (Patient not taking: Reported on 12/26/2021)   ALPRAZolam (XANAX) 0.5 MG tablet Take 0.5 mg by mouth at bedtime as needed for anxiety. (Patient not taking: Reported on 04/15/2021)   escitalopram (LEXAPRO) 10 MG tablet Take 1 tablet (10 mg total) by mouth daily. (Patient not taking: Reported on 04/15/2021)   gabapentin (NEURONTIN) 300 MG capsule Take 1 capsule (300 mg total) by mouth 3 (three) times daily. (Patient not taking: Reported on 04/20/2023)   No facility-administered encounter medications on file as of 04/20/2023.    Past Medical History:  Diagnosis Date   Anxiety    Asthma    Bone spur    right foot   Hypertension    MRSA infection    Stroke [facility])     Past Surgical History:  Procedure Laterality Date   ROTATOR CUFF REPAIR      TUBAL LIGATION      History reviewed. No pertinent family history.  Social History   Socioeconomic History   Marital status: Single    Spouse name: Not on file   Number of children: Not on file   Years of education: Not on file   Highest education level: Not on file  Occupational History   Not on file  Tobacco Use   Smoking status: Every Day    Current packs/day: 1.00    Types: Cigarettes   Smokeless tobacco: Never  Substance and Sexual Activity   Alcohol use: Yes    Comment: 2 shots liquor each night per pt   Drug use: Yes    Types: Cocaine    Comment: states not now, states no meth   Sexual activity: Not Currently  Other Topics Concern   Not on file  Social History Narrative   Not on file   Social Drivers of Health   Financial Resource Strain: Not on file  Food Insecurity: Not on file  Transportation Needs: Not on file  Physical Activity: Not on file  Stress: Not on file  Social Connections: Not on file  Intimate Partner Violence: Not on file    Review of Systems  Constitutional: Negative.   HENT: Negative.    Eyes: Negative.   Respiratory: Negative.  Negative for shortness of breath.   Cardiovascular: Negative.  Negative for chest pain.  Gastrointestinal:  Negative.  Negative for abdominal pain, constipation and diarrhea.  Genitourinary: Negative.   Musculoskeletal:  Negative for joint pain and myalgias.  Skin: Negative.   Neurological: Negative.  Negative for dizziness and headaches.  Endo/Heme/Allergies: Negative.   Psychiatric/Behavioral:  Positive for depression. The patient is nervous/anxious.   All other systems reviewed and are negative.     Objective   BP (!) 160/90   Pulse (!) 114   Ht 5\' 2"  (1.575 m)   Wt 162 lb 3.2 oz (73.6 kg)   SpO2 96%   BMI 29.67 kg/m   Physical Exam Vitals and nursing note reviewed.  Constitutional:      Appearance: Normal appearance. She is normal weight.  HENT:     Head: Normocephalic and atraumatic.      Nose: Nose normal.     Mouth/Throat:     Mouth: Mucous membranes are moist.  Eyes:     Extraocular Movements: Extraocular movements intact.     Conjunctiva/sclera: Conjunctivae normal.     Pupils: Pupils are equal, round, and reactive to light.  Cardiovascular:     Rate and Rhythm: Normal rate and regular rhythm.     Pulses: Normal pulses.     Heart sounds: Normal heart sounds.  Pulmonary:     Effort: Pulmonary effort is normal.     Breath sounds: Normal breath sounds.  Abdominal:     General: Abdomen is flat. Bowel sounds are normal.     Palpations: Abdomen is soft.  Musculoskeletal:        General: Normal range of motion.     Cervical back: Normal range of motion.  Skin:    General: Skin is warm and dry.  Neurological:     General: No focal deficit present.     Mental Status: She is alert and oriented to person, place, and time.  Psychiatric:        Mood and Affect: Mood normal.        Behavior: Behavior normal.        Thought Content: Thought content normal.        Judgment: Judgment normal.       04/20/2023    1:35 PM  GAD 7 : Generalized Anxiety Score  Nervous, Anxious, on Edge 1  Control/stop worrying 3  Worry too much - different things 3  Restless 1  Easily annoyed or irritable 3  Afraid - awful might happen 1    Flowsheet Row Office Visit from 04/20/2023 in [facility]  Thoughts that you would be better off dead, or of hurting yourself in some way Not at all  PHQ-9 Total Score 7           Assessment & Plan:  Referral sent to psychiatry Referral sent to ophthalmology Return for fasting lab work  Problem List Items Addressed This Visit       Other   Depression - Primary   Relevant Orders   Ambulatory referral to Psychiatry   Other Visit Diagnoses       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   Total time spent: 25 minutes  [employer], NP  04/20/2023   This document may have been prepared by Dragon Voice Recognition software and as such may include unintentional dictation errors.

## 2023-04-24 DIAGNOSIS — F32A Depression, unspecified: Secondary | ICD-10-CM | POA: Insufficient documentation

## 2023-05-16 ENCOUNTER — Emergency Department
Admission: EM | Admit: 2023-05-16 | Discharge: 2023-05-17 | Disposition: A | Discharge status: Home / Self Care | Payer: Medicaid Other | Attending: Emergency Medicine | Admitting: Emergency Medicine

## 2023-05-16 ENCOUNTER — Other Ambulatory Visit: Payer: Self-pay

## 2023-05-16 DIAGNOSIS — R112 Nausea with vomiting, unspecified: Secondary | ICD-10-CM | POA: Insufficient documentation

## 2023-05-16 DIAGNOSIS — Z20822 Contact with and (suspected) exposure to covid-19: Secondary | ICD-10-CM | POA: Diagnosis not present

## 2023-05-16 DIAGNOSIS — F419 Anxiety disorder, unspecified: Secondary | ICD-10-CM | POA: Diagnosis not present

## 2023-05-16 DIAGNOSIS — R0602 Shortness of breath: Secondary | ICD-10-CM | POA: Insufficient documentation

## 2023-05-16 DIAGNOSIS — J45909 Unspecified asthma, uncomplicated: Secondary | ICD-10-CM | POA: Diagnosis not present

## 2023-05-16 DIAGNOSIS — R0981 Nasal congestion: Secondary | ICD-10-CM | POA: Diagnosis not present

## 2023-05-16 DIAGNOSIS — R059 Cough, unspecified: Secondary | ICD-10-CM | POA: Insufficient documentation

## 2023-05-16 NOTE — ED Triage Notes (Signed)
EMS brought patient from home with reports of CP x3 weeks, migraine/SOB x 1 week. Pt reports taking naproxen intermittently with mild relief. CP is located on left side, sharp/stabbing, improves when lying on chest, non-radiating. (+) ETOH per EMS. Pt inconsolable on arrival.

## 2023-05-17 ENCOUNTER — Emergency Department: Payer: Medicaid Other

## 2023-05-17 LAB — CBC WITH DIFFERENTIAL/PLATELET
Abs Immature Granulocytes: 0.03 10*3/uL (ref 0.00–0.07)
Basophils Absolute: 0 10*3/uL (ref 0.0–0.1)
Basophils Relative: 0 %
Eosinophils Absolute: 0.1 10*3/uL (ref 0.0–0.5)
Eosinophils Relative: 2 %
HCT: 41.1 % (ref 36.0–46.0)
Hemoglobin: 14.7 g/dL (ref 12.0–15.0)
Immature Granulocytes: 0 %
Lymphocytes Relative: 48 %
Lymphs Abs: 4.3 10*3/uL — ABNORMAL HIGH (ref 0.7–4.0)
MCH: 32.6 pg (ref 26.0–34.0)
MCHC: 35.8 g/dL (ref 30.0–36.0)
MCV: 91.1 fL (ref 80.0–100.0)
Monocytes Absolute: 0.7 10*3/uL (ref 0.1–1.0)
Monocytes Relative: 7 %
Neutro Abs: 3.9 10*3/uL (ref 1.7–7.7)
Neutrophils Relative %: 43 %
Platelets: 278 10*3/uL (ref 150–400)
RBC: 4.51 MIL/uL (ref 3.87–5.11)
RDW: 12.3 % (ref 11.5–15.5)
WBC: 9.1 10*3/uL (ref 4.0–10.5)
nRBC: 0 % (ref 0.0–0.2)

## 2023-05-17 LAB — RESP PANEL BY RT-PCR (RSV, FLU A&B, COVID)  RVPGX2
Influenza A by PCR: NEGATIVE
Influenza B by PCR: NEGATIVE
Resp Syncytial Virus by PCR: NEGATIVE
SARS Coronavirus 2 by RT PCR: NEGATIVE

## 2023-05-17 LAB — BASIC METABOLIC PANEL
Anion gap: 11 (ref 5–15)
BUN: 20 mg/dL (ref 8–23)
CO2: 23 mmol/L (ref 22–32)
Calcium: 8 mg/dL — ABNORMAL LOW (ref 8.9–10.3)
Chloride: 108 mmol/L (ref 98–111)
Creatinine, Ser: 0.55 mg/dL (ref 0.44–1.00)
GFR, Estimated: >60 mL/min (ref >60)
Glucose, Bld: 113 mg/dL — ABNORMAL HIGH (ref 70–99)
Potassium: 3.8 mmol/L (ref 3.5–5.1)
Sodium: 142 mmol/L (ref 135–145)

## 2023-05-17 LAB — TROPONIN I (HIGH SENSITIVITY): Troponin I (High Sensitivity): 6 ng/L (ref <18)

## 2023-05-17 MED ORDER — SODIUM CHLORIDE 0.9 % IV BOLUS
1000.0000 mL | Freq: Once | INTRAVENOUS | Status: AC
  Administered 2023-05-17: 1000 mL via INTRAVENOUS

## 2023-05-17 MED ORDER — IBUPROFEN 600 MG PO TABS
600.0000 mg | ORAL_TABLET | Freq: Once | ORAL | Status: AC
  Administered 2023-05-17: 600 mg via ORAL
  Filled 2023-05-17: qty 1

## 2023-05-17 MED ORDER — DEXAMETHASONE SODIUM PHOSPHATE 10 MG/ML IJ SOLN
10.0000 mg | Freq: Once | INTRAMUSCULAR | Status: AC
  Administered 2023-05-17: 10 mg via INTRAVENOUS
  Filled 2023-05-17: qty 1

## 2023-05-17 MED ORDER — IPRATROPIUM-ALBUTEROL 0.5-2.5 (3) MG/3ML IN SOLN
3.0000 mL | Freq: Once | RESPIRATORY_TRACT | Status: AC
  Administered 2023-05-17: 3 mL via RESPIRATORY_TRACT
  Filled 2023-05-17: qty 3

## 2023-05-17 MED ORDER — PREDNISONE 10 MG PO TABS: 40.0000 mg | ORAL_TABLET | Freq: Every day | ORAL | 0 refills | Status: AC

## 2023-05-17 MED ORDER — ALBUTEROL SULFATE HFA 108 (90 BASE) MCG/ACT IN AERS: 2.0000 | INHALATION_SPRAY | Freq: Four times a day (QID) | RESPIRATORY_TRACT | 0 refills | Status: DC | PRN

## 2023-05-17 NOTE — ED Notes (Signed)
This RN has called lab x 2 to find out what the delay is on the ethanol level - no answer x 2. Dr. Anner Crete states the patient is still okay to be discharged. Pt is A&OX4, ambulatory with independent steady gait.

## 2023-05-17 NOTE — ED Notes (Signed)
Pt reports she is feeling much better. Marland Kitchen.The patient is A&OX4, ambulatory at d/c with independent steady gait, NAD. Pt verbalized understanding of d/c instructions, prescriptions and follow up care.

## 2023-05-17 NOTE — ED Notes (Signed)
Patient transported to X-ray

## 2023-05-17 NOTE — Discharge Instructions (Signed)
Please pick up the medication from the pharmacy and take as prescribed.  Please follow-up with [facility] for outpatient psychiatry assessment.

## 2023-05-17 NOTE — ED Provider Notes (Signed)
[facility] Provider Note    Event Date/Time   First MD Initiated Contact with Patient 05/16/23 2346     (approximate)   History   Shortness of Breath, Chest Pain, and Migraine   HPI Molly Greer is a 63 y.o. female with history of substance abuse disorder, anxiety, asthma presenting today for shortness of breath.  Patient states for the past 6 days she has had intermittent shortness of breath, productive cough, congestion.  Feels like she is having an asthma exacerbation and ran out of her inhaler.  1 episode of nausea and vomiting but not currently nauseous.  No other chest pain.  Denies abdominal pain, diarrhea, dysuria, leg pain, leg swelling.     Physical Exam   Triage Vital Signs: ED Triage Vitals  Encounter Vitals Group     BP --      Systolic BP Percentile --      Diastolic BP Percentile --      Pulse Rate 05/16/23 2356 (!) 107     Resp 05/16/23 2356 (!) 21     Temp 05/16/23 2356 98.2 F (36.8 C)     Temp Source 05/16/23 2356 Oral     SpO2 05/16/23 2356 98 %     Weight 05/16/23 2351 152 lb (68.9 kg)     Height 05/16/23 2351 5\' 2"  (1.575 m)     Head Circumference --      Peak Flow --      Pain Score 05/16/23 2351 10     Pain Loc --      Pain Education --      Exclude from Growth Chart --     Most recent vital signs: Vitals:   05/16/23 2356 05/17/23 0010  BP:  (!) 146/78  Pulse: (!) 107 98  Resp: (!) 21 16  Temp: 98.2 F (36.8 C)   SpO2: 98% 96%   Physical Exam: I have reviewed the vital signs and nursing notes. General: Awake, alert, no acute distress.  Nontoxic appearing. Head:  Atraumatic, normocephalic.   ENT:  EOM intact, PERRL. Oral mucosa is pink and moist with no lesions. Neck: Neck is supple with full range of motion, No meningeal signs. Cardiovascular:  RRR, No murmurs. Peripheral pulses palpable and equal bilaterally. Respiratory:  Symmetrical chest wall expansion.  No rhonchi, rales, or wheezes.  Good air  movement throughout.  No use of accessory muscles.   Musculoskeletal:  No cyanosis or edema. Moving extremities with full ROM Abdomen:  Soft, nontender, nondistended. Neuro:  GCS 15, moving all four extremities, interacting appropriately. Speech clear. Psych:  Calm, appropriate.   Skin:  Warm, dry, no rash.    ED Results / Procedures / Treatments   Labs (all labs ordered are listed, but only abnormal results are displayed) Labs Reviewed  BASIC METABOLIC PANEL - Abnormal; Notable for the following components:      Result Value   Glucose, Bld 113 (*)    Calcium 8.0 (*)    All other components within normal limits  CBC WITH DIFFERENTIAL/PLATELET - Abnormal; Notable for the following components:   Lymphs Abs 4.3 (*)    All other components within normal limits  RESP PANEL BY RT-PCR (RSV, FLU A&B, COVID)  RVPGX2  CBC WITH DIFFERENTIAL/PLATELET  ETHANOL  TROPONIN I (HIGH SENSITIVITY)     EKG My EKG interpretation: Rate of 99, normal sinus rhythm.  Incomplete right bundle branch block.  No acute ST elevations or depressions   RADIOLOGY Independently  interpreted chest x-ray with no acute pathology   PROCEDURES:  Critical Care performed: No  Procedures   MEDICATIONS ORDERED IN ED: Medications  ibuprofen (ADVIL) tablet 600 mg (has no administration in time range)  ipratropium-albuterol (DUONEB) 0.5-2.5 (3) MG/3ML nebulizer solution 3 mL (3 mLs Nebulization Given 05/17/23 0039)  dexamethasone (DECADRON) injection 10 mg (10 mg Intravenous Given 05/17/23 0039)  sodium chloride 0.9 % bolus 1,000 mL (1,000 mLs Intravenous New Bag/Given 05/17/23 0039)     IMPRESSION / MDM / ASSESSMENT AND PLAN / ED COURSE  I reviewed the triage vital signs and the nursing notes.                              Differential diagnosis includes, but is not limited to, asthma exacerbation, pneumonia, COVID, flu, RSV, anxiety  Patient's presentation is most consistent with acute complicated illness /  injury requiring diagnostic workup.  Patient is a 63 year old female presenting today for shortness of breath.  Vital signs on arrival are stable and no overt wheezing although patient does feel short of breath with a history of asthma.  Will give DuoNeb as well as Decadron.  Will also give 1 L fluids over concern for dehydration.  Laboratory workup shows unremarkable EKG and negative troponin.  CBC and BMP unremarkable.  Chest x-ray negative.  Respiratory panel negative for COVID, flu, RSV.  Patient was reassessed with no wheezing symptoms or tachypnea.  She is very anxious and intermittently tearful.  I suspect while this may be some component of asthma, there is a larger component of anxiety contributing to her symptoms today.  Will discharge with albuterol inhaler and prednisone.  Also gave her referral for [facility] for psychiatry evaluation.  The patient is on the cardiac monitor to evaluate for evidence of arrhythmia and/or significant heart rate changes. Clinical Course as of 05/17/23 0206  Wed May 17, 2023  0204 Reassessed with no ongoing breathing symptoms.  Patient very anxious and likely suspect this is the source of all her symptoms today [DW]    Clinical Course User Index [DW] Janith Lima, MD     FINAL CLINICAL IMPRESSION(S) / ED DIAGNOSES   Final diagnoses:  Shortness of breath  Anxiety     Rx / DC Orders   ED Discharge Orders          Ordered    predniSONE (DELTASONE) 10 MG tablet  Daily        05/17/23 0205    albuterol (VENTOLIN HFA) 108 (90 Base) MCG/ACT inhaler  Every 6 hours PRN        05/17/23 0205             Note:  This document was prepared using Dragon voice recognition software and may include unintentional dictation errors.   Janith Lima, MD 05/17/23 [phone]

## 2023-05-26 ENCOUNTER — Ambulatory Visit: Payer: Medicaid Other | Admitting: Cardiology

## 2023-06-12 ENCOUNTER — Other Ambulatory Visit: Payer: Self-pay

## 2023-06-12 ENCOUNTER — Emergency Department

## 2023-06-12 ENCOUNTER — Emergency Department
Admission: EM | Admit: 2023-06-12 | Discharge: 2023-06-12 | Disposition: A | Discharge status: Home / Self Care | Attending: Emergency Medicine | Admitting: Emergency Medicine

## 2023-06-12 DIAGNOSIS — R0789 Other chest pain: Secondary | ICD-10-CM | POA: Diagnosis present

## 2023-06-12 DIAGNOSIS — R0781 Pleurodynia: Secondary | ICD-10-CM | POA: Insufficient documentation

## 2023-06-12 LAB — RESP PANEL BY RT-PCR (RSV, FLU A&B, COVID)  RVPGX2
Influenza A by PCR: NEGATIVE
Influenza B by PCR: NEGATIVE
Resp Syncytial Virus by PCR: NEGATIVE
SARS Coronavirus 2 by RT PCR: NEGATIVE

## 2023-06-12 LAB — CBC
HCT: 42.2 % (ref 36.0–46.0)
Hemoglobin: 14.5 g/dL (ref 12.0–15.0)
MCH: 32.7 pg (ref 26.0–34.0)
MCHC: 34.4 g/dL (ref 30.0–36.0)
MCV: 95 fL (ref 80.0–100.0)
Platelets: 280 10*3/uL (ref 150–400)
RBC: 4.44 MIL/uL (ref 3.87–5.11)
RDW: 13 % (ref 11.5–15.5)
WBC: 10.4 10*3/uL (ref 4.0–10.5)
nRBC: 0 % (ref 0.0–0.2)

## 2023-06-12 LAB — COMPREHENSIVE METABOLIC PANEL
ALT: 86 U/L — ABNORMAL HIGH (ref 0–44)
AST: 106 U/L — ABNORMAL HIGH (ref 15–41)
Albumin: 4.4 g/dL (ref 3.5–5.0)
Alkaline Phosphatase: 93 U/L (ref 38–126)
Anion gap: 13 (ref 5–15)
BUN: 9 mg/dL (ref 8–23)
CO2: 19 mmol/L — ABNORMAL LOW (ref 22–32)
Calcium: 9.2 mg/dL (ref 8.9–10.3)
Chloride: 105 mmol/L (ref 98–111)
Creatinine, Ser: 0.62 mg/dL (ref 0.44–1.00)
GFR, Estimated: >60 mL/min (ref >60)
Glucose, Bld: 95 mg/dL (ref 70–99)
Potassium: 4 mmol/L (ref 3.5–5.1)
Sodium: 137 mmol/L (ref 135–145)
Total Bilirubin: 0.9 mg/dL (ref 0.0–1.2)
Total Protein: 7.7 g/dL (ref 6.5–8.1)

## 2023-06-12 MED ORDER — HYDROCODONE-ACETAMINOPHEN 5-325 MG PO TABS: 1.0000 | ORAL_TABLET | Freq: Four times a day (QID) | ORAL | 0 refills | Status: AC | PRN

## 2023-06-12 MED ORDER — HYDROCODONE-ACETAMINOPHEN 5-325 MG PO TABS
1.0000 | ORAL_TABLET | Freq: Once | ORAL | Status: AC
  Administered 2023-06-12: 1 via ORAL
  Filled 2023-06-12: qty 1

## 2023-06-12 MED ORDER — LIDOCAINE 5 % EX PTCH
1.0000 | MEDICATED_PATCH | Freq: Once | CUTANEOUS | Status: DC
  Administered 2023-06-12: 1 via TRANSDERMAL
  Filled 2023-06-12: qty 1

## 2023-06-12 MED ORDER — KETOROLAC TROMETHAMINE 15 MG/ML IJ SOLN
15.0000 mg | Freq: Once | INTRAMUSCULAR | Status: AC
  Administered 2023-06-12: 15 mg via INTRAMUSCULAR
  Filled 2023-06-12: qty 1

## 2023-06-12 NOTE — ED Provider Notes (Signed)
 [facility] Provider Note    Event Date/Time   First MD Initiated Contact with Patient 06/12/23 1708     (approximate)   History   Rib Injury   HPI  Molly Greer is a 63 year old female with history of asthma presenting to the emergency department for evaluation of left chest wall pain.  Several weeks ago she was opening a window when she felt pain over her left side.  Had been improving, but today was working in her garden doing some lifting when she developed recurrent pain over her left side.  Has not fallen on the area.  Does report some shortness of breath as well.  Recently treated for asthma flare.     Physical Exam   Triage Vital Signs: ED Triage Vitals  Encounter Vitals Group     BP 06/12/23 1356 (!) 175/107     Systolic BP Percentile --      Diastolic BP Percentile --      Pulse Rate 06/12/23 1356 (!) 104     Resp 06/12/23 1356 18     Temp 06/12/23 1356 98.2 F (36.8 C)     Temp Source 06/12/23 1356 Oral     SpO2 06/12/23 1356 94 %     Weight 06/12/23 1357 151 lb 14.4 oz (68.9 kg)     Height 06/12/23 1357 5\' 2"  (1.575 m)     Head Circumference --      Peak Flow --      Pain Score 06/12/23 1357 10     Pain Loc --      Pain Education --      Exclude from Growth Chart --     Most recent vital signs: Vitals:   06/12/23 1356  BP: (!) 175/107  Pulse: (!) 104  Resp: 18  Temp: 98.2 F (36.8 C)  SpO2: 94%     General: Awake, interactive  CV:  Regular rate, good peripheral perfusion.  Resp:  Unlabored respirations, lungs clear to auscultation Chest wall: Reproducible tenderness palpation over the lower rib spaces anteriorly on the left without area of point tenderness, open skin, overlying skin changes Abd:  Nondistended, nontender Neuro:  Symmetric facial movement, fluid speech   ED Results / Procedures / Treatments   Labs (all labs ordered are listed, but only abnormal results are displayed) Labs Reviewed  COMPREHENSIVE  METABOLIC PANEL - Abnormal; Notable for the following components:      Result Value   CO2 19 (*)    AST 106 (*)    ALT 86 (*)    All other components within normal limits  RESP PANEL BY RT-PCR (RSV, FLU A&B, COVID)  RVPGX2  CBC     EKG EKG independently reviewed interpreted by myself (ER attending) demonstrates:  EKG demonstrates normal sinus rhythm rate of 84, PR 168, QRS 70, QTc 423, no acute ST changes  RADIOLOGY Imaging independently reviewed and interpreted by myself demonstrates:  CXR without focal consolidation, visible rib fracture, pneumothorax  PROCEDURES:  Critical Care performed: No  Procedures   MEDICATIONS ORDERED IN ED: Medications  HYDROcodone-acetaminophen (NORCO/VICODIN) 5-325 MG per tablet 1 tablet (has no administration in time range)  ketorolac (TORADOL) 15 MG/ML injection 15 mg (has no administration in time range)  lidocaine (LIDODERM) 5 % 1 patch (has no administration in time range)     IMPRESSION / MDM / ASSESSMENT AND PLAN / ED COURSE  I reviewed the triage vital signs and the nursing notes.  Differential diagnosis  includes, but is not limited to, rib fracture, pneumothorax, bruised ribs, pneumonia  Patient's presentation is most consistent with acute presentation with potential threat to life or bodily function.  63 year old female presenting with left chest wall pain.  Mild tachycardia on presentation.  Labs from triage are reassuring.  X-Zoeann Mol without visible pneumothorax, rib injury, other acute finding.  EKG reassuring.  Exam is most consistent with musculoskeletal strain.  Somewhat atypical mechanism, but patient does report history of prior rib fracture with resultant pneumonia, so she was given incentive spirometer.  Discussed multimodal pain control.  Satting 100% on room air here with reassuring lung exam.  Do think she is stable for discharge.  Patient is comfortable with discharge.  Strict return precautions provided.      FINAL  CLINICAL IMPRESSION(S) / ED DIAGNOSES   Final diagnoses:  Rib pain on left side     Rx / DC Orders   ED Discharge Orders          Ordered    HYDROcodone-acetaminophen (NORCO/VICODIN) 5-325 MG tablet  Every 6 hours PRN        06/12/23 1737             Note:  This document was prepared using Dragon voice recognition software and may include unintentional dictation errors.   Trinna Post, MD 06/12/23 1739

## 2023-06-12 NOTE — Discharge Instructions (Signed)
 You were seen in the ER today for your rib and chest wall pain.  Your testing fortunately did not show any emergency findings.  I suspect you may have a strained muscle or bruised rib.  You can take Tylenol and naproxen to help with your pain.  If you have breakthrough pain, I sent a short course of narcotic pain medicine to your pharmacy.  This does make you drowsy, do not drive or operate machinery when taking this.  You can also try over-the-counter lidocaine patches to help with your pain.  Use the incentive spirometer you were given a few times an hour when you are awake until your pain has improved to prevent a pneumonia.  Follow with your primary care doctor for further evaluation.  Return to the ER for new or worsening symptoms.

## 2023-06-12 NOTE — ED Notes (Signed)
 First Nurse Note: Pt to ED via [facility] from home for left rib pain. Pain started when she went to pick something up. Pt states that the pain felt like a popping sensation. Pt seen for same recently.   HR: 110 SpO2: 97 BP: 178/86

## 2023-06-12 NOTE — ED Triage Notes (Signed)
 Pt here with left rib pain since May 17, 2023. Pt states she was pulling down a window and felt weak and felt like something popped. Pt states she has been having pain since then. Pt endorses SOB as well.

## 2023-06-16 ENCOUNTER — Other Ambulatory Visit: Payer: Self-pay

## 2023-06-16 ENCOUNTER — Other Ambulatory Visit

## 2023-06-16 ENCOUNTER — Encounter: Payer: Self-pay | Admitting: Cardiology

## 2023-06-16 ENCOUNTER — Ambulatory Visit: Admitting: Cardiology

## 2023-06-16 ENCOUNTER — Other Ambulatory Visit: Payer: Self-pay | Admitting: Cardiology

## 2023-06-16 VITALS — BP 176/92 | HR 121 | Ht 62.0 in | Wt 165.0 lb

## 2023-06-16 DIAGNOSIS — Z720 Tobacco use: Secondary | ICD-10-CM

## 2023-06-16 DIAGNOSIS — I10 Essential (primary) hypertension: Secondary | ICD-10-CM | POA: Diagnosis not present

## 2023-06-16 DIAGNOSIS — R0602 Shortness of breath: Secondary | ICD-10-CM | POA: Diagnosis not present

## 2023-06-16 DIAGNOSIS — R0781 Pleurodynia: Secondary | ICD-10-CM | POA: Diagnosis not present

## 2023-06-16 DIAGNOSIS — F32A Depression, unspecified: Secondary | ICD-10-CM

## 2023-06-16 DIAGNOSIS — R0789 Other chest pain: Secondary | ICD-10-CM | POA: Insufficient documentation

## 2023-06-16 MED ORDER — ALBUTEROL SULFATE HFA 108 (90 BASE) MCG/ACT IN AERS: 2.0000 | INHALATION_SPRAY | Freq: Four times a day (QID) | RESPIRATORY_TRACT | 1 refills | Status: DC | PRN

## 2023-06-16 MED ORDER — BACLOFEN 10 MG PO TABS
10.0000 mg | ORAL_TABLET | Freq: Every day | ORAL | 1 refills | Status: DC
Start: 2023-06-16 — End: 2023-08-16

## 2023-06-16 MED ORDER — ALBUTEROL SULFATE HFA 108 (90 BASE) MCG/ACT IN AERS
2.0000 | INHALATION_SPRAY | Freq: Four times a day (QID) | RESPIRATORY_TRACT | 0 refills | Status: DC | PRN
Start: 2023-06-16 — End: 2023-06-16

## 2023-06-16 NOTE — Progress Notes (Signed)
 Established Patient Office Visit  Subjective:  Patient ID: Molly Greer, female    DOB: 03/05/1961  Age: 63 y.o. MRN: [id number]  Chief Complaint  Patient presents with   Hospitalization Follow-up    Hospital Follow Up    Patient in office for hospital follow up. Patient went to ED on 05/17/23 for asthma exacerbation and 06/12/23 for rib injury. Chest x-rays both visits were normal. Patient comes in today, continues to complain of left side pain from rib injury. Patient taking Vicodin and ibuprofen as needed for pain. Will send in Baclofen.   Patient continues to have shortness of breath, also continues to smoke cigarettes. Will send a referral to pulmonary.  Patient tearful today, increase in anxiety. Patient referred to psychiatry at previous visit, has not heard from them. Will give number to patient today for her to call to get scheduled.     No other concerns at this time.   Past Medical History:  Diagnosis Date   Anxiety    Asthma    Bone spur    right foot   Hypertension    MRSA infection    Stroke [facility])     Past Surgical History:  Procedure Laterality Date   ROTATOR CUFF REPAIR     TUBAL LIGATION      Social History   Socioeconomic History   Marital status: Single    Spouse name: Not on file   Number of children: Not on file   Years of education: Not on file   Highest education level: Not on file  Occupational History   Not on file  Tobacco Use   Smoking status: Every Day    Current packs/day: 1.00    Types: Cigarettes   Smokeless tobacco: Never  Substance and Sexual Activity   Alcohol use: Yes    Comment: 2 shots liquor each night per pt   Drug use: Yes    Types: Cocaine    Comment: states not now, states no meth   Sexual activity: Not Currently  Other Topics Concern   Not on file  Social History Narrative   Not on file   Social Drivers of Health   Financial Resource Strain: Not on file  Food Insecurity: Not on file  Transportation Needs:  Not on file  Physical Activity: Not on file  Stress: Not on file  Social Connections: Not on file  Intimate Partner Violence: Not on file    History reviewed. No pertinent family history.  Allergies  Allergen Reactions   Penicillins Swelling    Outpatient Medications Prior to Visit  Medication Sig   [DISCONTINUED] albuterol (VENTOLIN HFA) 108 (90 Base) MCG/ACT inhaler Inhale 2 puffs into the lungs every 6 (six) hours as needed for wheezing or shortness of breath.   ALPRAZolam (XANAX) 0.5 MG tablet Take 0.5 mg by mouth at bedtime as needed for anxiety. (Patient not taking: Reported on 04/15/2021)   escitalopram (LEXAPRO) 10 MG tablet Take 1 tablet (10 mg total) by mouth daily. (Patient not taking: Reported on 04/15/2021)   gabapentin (NEURONTIN) 300 MG capsule Take 1 capsule (300 mg total) by mouth 3 (three) times daily. (Patient not taking: Reported on 04/20/2023)   HYDROcodone-acetaminophen (NORCO/VICODIN) 5-325 MG tablet Take 1 tablet by mouth every 6 (six) hours as needed for up to 5 days for moderate pain (pain score 4-6).   [DISCONTINUED] albuterol (VENTOLIN HFA) 108 (90 Base) MCG/ACT inhaler Inhale 2 puffs into the lungs every 4 (four) hours as needed for wheezing  or shortness of breath. (Patient not taking: Reported on 12/26/2021)   No facility-administered medications prior to visit.    Review of Systems  Constitutional: Negative.   HENT: Negative.    Eyes: Negative.   Respiratory:  Positive for shortness of breath.   Cardiovascular: Negative.  Negative for chest pain.  Gastrointestinal: Negative.  Negative for abdominal pain, constipation and diarrhea.  Genitourinary: Negative.   Musculoskeletal:  Negative for joint pain and myalgias.       Left side pain  Skin: Negative.   Neurological: Negative.  Negative for dizziness and headaches.  Endo/Heme/Allergies: Negative.   Psychiatric/Behavioral:  Positive for depression. The patient is nervous/anxious.   All other systems  reviewed and are negative.      Objective:   BP (!) 176/92   Pulse (!) 121   Ht 5\' 2"  (1.575 m)   Wt 165 lb (74.8 kg)   SpO2 96%   BMI 30.18 kg/m   Vitals:   06/16/23 0934  BP: (!) 176/92  Pulse: (!) 121  Height: 5\' 2"  (1.575 m)  Weight: 165 lb (74.8 kg)  SpO2: 96%  BMI (Calculated): 30.17    Physical Exam Vitals and nursing note reviewed.  Constitutional:      Appearance: Normal appearance. She is normal weight.  HENT:     Head: Normocephalic and atraumatic.     Nose: Nose normal.     Mouth/Throat:     Mouth: Mucous membranes are moist.  Eyes:     Extraocular Movements: Extraocular movements intact.     Conjunctiva/sclera: Conjunctivae normal.     Pupils: Pupils are equal, round, and reactive to light.  Cardiovascular:     Rate and Rhythm: Normal rate and regular rhythm.     Pulses: Normal pulses.     Heart sounds: Normal heart sounds.  Pulmonary:     Effort: Pulmonary effort is normal.     Breath sounds: Normal breath sounds.  Abdominal:     General: Abdomen is flat. Bowel sounds are normal.     Palpations: Abdomen is soft.  Musculoskeletal:        General: Normal range of motion.     Cervical back: Normal range of motion.  Skin:    General: Skin is warm and dry.  Neurological:     General: No focal deficit present.     Mental Status: She is alert and oriented to person, place, and time.  Psychiatric:        Mood and Affect: Mood normal.        Behavior: Behavior normal.        Thought Content: Thought content normal.        Judgment: Judgment normal.      No results found for any visits on 06/16/23.  Recent Results (from the past 2160 hours)  Basic metabolic panel     Status: Abnormal   Collection Time: 05/17/23 12:33 AM  Result Value Ref Range   Sodium 142 135 - 145 mmol/L   Potassium 3.8 3.5 - 5.1 mmol/L   Chloride 108 98 - 111 mmol/L   CO2 23 22 - 32 mmol/L   Glucose, Bld 113 (H) 70 - 99 mg/dL    Comment: Glucose reference range  applies only to samples taken after fasting for at least 8 hours.   BUN 20 8 - 23 mg/dL   Creatinine, Ser 8.29 0.44 - 1.00 mg/dL   Calcium 8.0 (L) 8.9 - 10.3 mg/dL   GFR, Estimated >56 >21  mL/min    Comment: (NOTE) Calculated using the CKD-EPI Creatinine Equation (2021)    Anion gap 11 5 - 15    Comment: Performed at [facility], [address] [place], [place] [address]  CBC with Differential/Platelet     Status: Abnormal   Collection Time: 05/17/23 12:33 AM  Result Value Ref Range   WBC 9.1 4.0 - 10.5 K/uL   RBC 4.51 3.87 - 5.11 MIL/uL   Hemoglobin 14.7 12.0 - 15.0 g/dL   HCT 62.1 30.8 - 65.7 %   MCV 91.1 80.0 - 100.0 fL   MCH 32.6 26.0 - 34.0 pg   MCHC 35.8 30.0 - 36.0 g/dL   RDW 84.6 96.2 - 95.2 %   Platelets 278 150 - 400 K/uL   nRBC 0.0 0.0 - 0.2 %   Neutrophils Relative % 43 %   Neutro Abs 3.9 1.7 - 7.7 K/uL   Lymphocytes Relative 48 %   Lymphs Abs 4.3 (H) 0.7 - 4.0 K/uL   Monocytes Relative 7 %   Monocytes Absolute 0.7 0.1 - 1.0 K/uL   Eosinophils Relative 2 %   Eosinophils Absolute 0.1 0.0 - 0.5 K/uL   Basophils Relative 0 %   Basophils Absolute 0.0 0.0 - 0.1 K/uL   Immature Granulocytes 0 %   Abs Immature Granulocytes 0.03 0.00 - 0.07 K/uL    Comment: Performed at [facility], [address] [place], [place] [address]  Troponin I (High Sensitivity)     Status: None   Collection Time: 05/17/23 12:33 AM  Result Value Ref Range   Troponin I (High Sensitivity) 6 <18 ng/L    Comment: (NOTE) Elevated high sensitivity troponin I (hsTnI) values and significant  changes across serial measurements may suggest ACS but many other  chronic and acute conditions are known to elevate hsTnI results.  Refer to the "Links" section for chest pain algorithms and additional  guidance. Performed at [facility], [address] [place], [place] [address]   Resp panel by RT-PCR (RSV, Flu A&B, Covid) Anterior Nasal Swab     Status: None    Collection Time: 05/17/23 12:45 AM   Specimen: Anterior Nasal Swab  Result Value Ref Range   SARS Coronavirus 2 by RT PCR NEGATIVE NEGATIVE    Comment: (NOTE) SARS-CoV-2 target nucleic acids are NOT DETECTED.  The SARS-CoV-2 RNA is generally detectable in upper respiratory specimens during the acute phase of infection. The lowest concentration of SARS-CoV-2 viral copies this assay can detect is 138 copies/mL. A negative result does not preclude SARS-Cov-2 infection and should not be used as the sole basis for treatment or other patient management decisions. A negative result may occur with  improper specimen collection/handling, submission of specimen other than nasopharyngeal swab, presence of viral mutation(s) within the areas targeted by this assay, and inadequate number of viral copies(<138 copies/mL). A negative result must be combined with clinical observations, patient history, and epidemiological information. The expected result is Negative.  Fact Sheet for Patients:  BloggerCourse.com  Fact Sheet for Healthcare Providers:  SeriousBroker.it  This test is no t yet approved or cleared by the [place] FDA and  has been authorized for detection and/or diagnosis of SARS-CoV-2 by FDA under an Emergency Use Authorization (EUA). This EUA will remain  in effect (meaning this test can be used) for the duration of the COVID-19 declaration under Section 564(b)(1) of the Act, 21 U.S.C.section 360bbb-3(b)(1), unless the authorization is terminated  or revoked sooner.  Influenza A by PCR NEGATIVE NEGATIVE   Influenza B by PCR NEGATIVE NEGATIVE    Comment: (NOTE) The Xpert Xpress SARS-CoV-2/FLU/RSV plus assay is intended as an aid in the diagnosis of influenza from Nasopharyngeal swab specimens and should not be used as a sole basis for treatment. Nasal washings and aspirates are unacceptable for Xpert Xpress  SARS-CoV-2/FLU/RSV testing.  Fact Sheet for Patients: BloggerCourse.com  Fact Sheet for Healthcare Providers: SeriousBroker.it  This test is not yet approved or cleared by the [place] FDA and has been authorized for detection and/or diagnosis of SARS-CoV-2 by FDA under an Emergency Use Authorization (EUA). This EUA will remain in effect (meaning this test can be used) for the duration of the COVID-19 declaration under Section 564(b)(1) of the Act, 21 U.S.C. section 360bbb-3(b)(1), unless the authorization is terminated or revoked.     Resp Syncytial Virus by PCR NEGATIVE NEGATIVE    Comment: (NOTE) Fact Sheet for Patients: BloggerCourse.com  Fact Sheet for Healthcare Providers: SeriousBroker.it  This test is not yet approved or cleared by the [place] FDA and has been authorized for detection and/or diagnosis of SARS-CoV-2 by FDA under an Emergency Use Authorization (EUA). This EUA will remain in effect (meaning this test can be used) for the duration of the COVID-19 declaration under Section 564(b)(1) of the Act, 21 U.S.C. section 360bbb-3(b)(1), unless the authorization is terminated or revoked.  Performed at [facility], [address] [place], [place] [address]   CBC     Status: None   Collection Time: 06/12/23  1:59 PM  Result Value Ref Range   WBC 10.4 4.0 - 10.5 K/uL   RBC 4.44 3.87 - 5.11 MIL/uL   Hemoglobin 14.5 12.0 - 15.0 g/dL   HCT 19.1 47.8 - 29.5 %   MCV 95.0 80.0 - 100.0 fL   MCH 32.7 26.0 - 34.0 pg   MCHC 34.4 30.0 - 36.0 g/dL   RDW 62.1 30.8 - 65.7 %   Platelets 280 150 - 400 K/uL   nRBC 0.0 0.0 - 0.2 %    Comment: Performed at [facility], [address] [place], [place] [address]  Comprehensive metabolic panel     Status: Abnormal   Collection Time: 06/12/23  1:59 PM  Result Value Ref Range   Sodium 137 135 - 145  mmol/L   Potassium 4.0 3.5 - 5.1 mmol/L   Chloride 105 98 - 111 mmol/L   CO2 19 (L) 22 - 32 mmol/L   Glucose, Bld 95 70 - 99 mg/dL    Comment: Glucose reference range applies only to samples taken after fasting for at least 8 hours.   BUN 9 8 - 23 mg/dL   Creatinine, Ser 2.95 0.44 - 1.00 mg/dL   Calcium 9.2 8.9 - 28.4 mg/dL   Total Protein 7.7 6.5 - 8.1 g/dL   Albumin 4.4 3.5 - 5.0 g/dL   AST 132 (H) 15 - 41 U/L   ALT 86 (H) 0 - 44 U/L   Alkaline Phosphatase 93 38 - 126 U/L   Total Bilirubin 0.9 0.0 - 1.2 mg/dL   GFR, Estimated >44 >01 mL/min    Comment: (NOTE) Calculated using the CKD-EPI Creatinine Equation (2021)    Anion gap 13 5 - 15    Comment: Performed at [facility], [address] [place], [place] [address]  Resp panel by RT-PCR (RSV, Flu A&B, Covid) Anterior Nasal Swab     Status: None   Collection Time: 06/12/23  1:59 PM   Specimen: Anterior Nasal Swab  Result Value Ref Range   SARS Coronavirus 2 by RT PCR NEGATIVE NEGATIVE    Comment: (NOTE) SARS-CoV-2 target nucleic acids are NOT DETECTED.  The SARS-CoV-2 RNA is generally detectable in upper respiratory specimens during the acute phase of infection. The lowest concentration of SARS-CoV-2 viral copies this assay can detect is 138 copies/mL. A negative result does not preclude SARS-Cov-2 infection and should not be used as the sole basis for treatment or other patient management decisions. A negative result may occur with  improper specimen collection/handling, submission of specimen other than nasopharyngeal swab, presence of viral mutation(s) within the areas targeted by this assay, and inadequate number of viral copies(<138 copies/mL). A negative result must be combined with clinical observations, patient history, and epidemiological information. The expected result is Negative.  Fact Sheet for Patients:  BloggerCourse.com  Fact Sheet for Healthcare Providers:   SeriousBroker.it  This test is no t yet approved or cleared by the [place] FDA and  has been authorized for detection and/or diagnosis of SARS-CoV-2 by FDA under an Emergency Use Authorization (EUA). This EUA will remain  in effect (meaning this test can be used) for the duration of the COVID-19 declaration under Section 564(b)(1) of the Act, 21 U.S.C.section 360bbb-3(b)(1), unless the authorization is terminated  or revoked sooner.       Influenza A by PCR NEGATIVE NEGATIVE   Influenza B by PCR NEGATIVE NEGATIVE    Comment: (NOTE) The Xpert Xpress SARS-CoV-2/FLU/RSV plus assay is intended as an aid in the diagnosis of influenza from Nasopharyngeal swab specimens and should not be used as a sole basis for treatment. Nasal washings and aspirates are unacceptable for Xpert Xpress SARS-CoV-2/FLU/RSV testing.  Fact Sheet for Patients: BloggerCourse.com  Fact Sheet for Healthcare Providers: SeriousBroker.it  This test is not yet approved or cleared by the [place] FDA and has been authorized for detection and/or diagnosis of SARS-CoV-2 by FDA under an Emergency Use Authorization (EUA). This EUA will remain in effect (meaning this test can be used) for the duration of the COVID-19 declaration under Section 564(b)(1) of the Act, 21 U.S.C. section 360bbb-3(b)(1), unless the authorization is terminated or revoked.     Resp Syncytial Virus by PCR NEGATIVE NEGATIVE    Comment: (NOTE) Fact Sheet for Patients: BloggerCourse.com  Fact Sheet for Healthcare Providers: SeriousBroker.it  This test is not yet approved or cleared by the [place] FDA and has been authorized for detection and/or diagnosis of SARS-CoV-2 by FDA under an Emergency Use Authorization (EUA). This EUA will remain in effect (meaning this test can be used) for the duration of  the COVID-19 declaration under Section 564(b)(1) of the Act, 21 U.S.C. section 360bbb-3(b)(1), unless the authorization is terminated or revoked.  Performed at [facility], [address] [place], [place] [address]       Assessment & Plan:  Baclofen for side pain. Referral sent to pulmonary. Call psychiatry to get an appointment.  Problem List Items Addressed This Visit       Other   Depression   Shortness of breath - Primary   Relevant Orders   Ambulatory referral to Pulmonology   Tobacco abuse   Relevant Orders   Ambulatory referral to Pulmonology   Rib pain on left side    Return in about 4 weeks (around 07/14/2023).   Total time spent: 25 minutes  [employer], NP  06/16/2023   This document may have been prepared  by [employer] and as such may include unintentional dictation errors.

## 2023-06-17 LAB — TSH: TSH: 1.11 u[IU]/mL (ref 0.450–4.500)

## 2023-06-19 ENCOUNTER — Telehealth: Payer: Self-pay

## 2023-06-19 NOTE — Telephone Encounter (Signed)
 Pt called and stated that she is still having extreme pain and has been doing everything you recommended- please advise on what can be done for pt

## 2023-06-21 ENCOUNTER — Encounter: Payer: Self-pay | Admitting: Student in an Organized Health Care Education/Training Program

## 2023-07-06 ENCOUNTER — Ambulatory Visit: Payer: Self-pay | Admitting: Psychiatry

## 2023-07-12 ENCOUNTER — Ambulatory Visit: Admitting: Psychiatry

## 2023-07-14 ENCOUNTER — Ambulatory Visit: Admitting: Cardiology

## 2023-07-18 ENCOUNTER — Ambulatory Visit: Admitting: Cardiology

## 2023-07-19 ENCOUNTER — Encounter: Payer: Self-pay | Admitting: Psychiatry

## 2023-07-19 ENCOUNTER — Ambulatory Visit (INDEPENDENT_AMBULATORY_CARE_PROVIDER_SITE_OTHER): Admitting: Psychiatry

## 2023-07-19 VITALS — BP 148/88 | HR 113 | Temp 98.0°F | Ht 62.0 in | Wt 175.2 lb

## 2023-07-19 DIAGNOSIS — F332 Major depressive disorder, recurrent severe without psychotic features: Secondary | ICD-10-CM

## 2023-07-19 DIAGNOSIS — F411 Generalized anxiety disorder: Secondary | ICD-10-CM

## 2023-07-19 MED ORDER — HYDROXYZINE HCL 10 MG PO TABS: 25.0000 mg | ORAL_TABLET | Freq: Three times a day (TID) | ORAL | 0 refills | Status: DC | PRN

## 2023-07-19 MED ORDER — LAMOTRIGINE 25 MG PO TABS: 25.0000 mg | ORAL_TABLET | Freq: Every day | ORAL | 0 refills | Status: DC
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
## 2023-07-25 ENCOUNTER — Ambulatory Visit: Admitting: Cardiology

## 2023-07-25 ENCOUNTER — Encounter: Payer: Self-pay | Admitting: Cardiology

## 2023-07-25 VITALS — BP 166/94 | HR 128 | Ht 62.0 in | Wt 174.0 lb

## 2023-07-25 DIAGNOSIS — K4090 Unilateral inguinal hernia, without obstruction or gangrene, not specified as recurrent: Secondary | ICD-10-CM | POA: Insufficient documentation

## 2023-07-25 DIAGNOSIS — F32A Depression, unspecified: Secondary | ICD-10-CM

## 2023-07-25 DIAGNOSIS — R000 Tachycardia, unspecified: Secondary | ICD-10-CM | POA: Insufficient documentation

## 2023-07-25 MED ORDER — ALBUTEROL SULFATE HFA 108 (90 BASE) MCG/ACT IN AERS: 2.0000 | INHALATION_SPRAY | Freq: Four times a day (QID) | RESPIRATORY_TRACT | 1 refills | Status: DC | PRN

## 2023-07-25 MED ORDER — METOPROLOL SUCCINATE ER 25 MG PO TB24: 25.0000 mg | ORAL_TABLET | Freq: Every day | ORAL | 11 refills | Status: DC

## 2023-07-25 NOTE — Progress Notes (Signed)
 Established Patient Office Visit  Subjective:  Patient ID: Molly Greer, female    DOB: 08-23-1960  Age: 63 y.o. MRN: [id number]  Chief Complaint  Patient presents with   Follow-up    4 Weeks Follow Up    Patient in office for 4 week follow up. Patient seen by psychiatry on 07/19/2023, started on Lamictal . Patient much less tearful today.  Patient scheduled to see pulmonary on 08/02/2023. Complains of coughing up phlegm, recommend taking Mucinex.  Patient complaining of pain, large lump in right groin. On exam, large inguinal hernia. CT scan 04/2021 "fat containing hernia in the right groin which has increased in size since the prior CT. No bowel herniation or fluid Collection". Will refer to general surgery.  Patient's heart rate consistently elevated, will start metoprolol  25 mg at bedtime.     No other concerns at this time.   Past Medical History:  Diagnosis Date   Anxiety    Asthma    Bone spur    right foot   Hypertension    MRSA infection    Stroke [facility])     Past Surgical History:  Procedure Laterality Date   ROTATOR CUFF REPAIR     TUBAL LIGATION      Social History   Socioeconomic History   Marital status: Single    Spouse name: Not on file   Number of children: Not on file   Years of education: Not on file   Highest education level: Not on file  Occupational History   Not on file  Tobacco Use   Smoking status: Every Day    Current packs/day: 0.50    Types: Cigarettes   Smokeless tobacco: Never  Vaping Use   Vaping status: Former  Substance and Sexual Activity   Alcohol use: Yes    Comment: 2 shots liquor each night per pt   Drug use: Yes    Types: Cocaine    Comment: states not now, states no meth   Sexual activity: Not Currently  Other Topics Concern   Not on file  Social History Narrative   Not on file   Social Drivers of Health   Financial Resource Strain: Not on file  Food Insecurity: Not on file  Transportation Needs: Not on file   Physical Activity: Not on file  Stress: Not on file  Social Connections: Not on file  Intimate Partner Violence: Not on file    History reviewed. No pertinent family history.  Allergies  Allergen Reactions   Penicillins Swelling    Outpatient Medications Prior to Visit  Medication Sig   baclofen  (LIORESAL ) 10 MG tablet Take 1 tablet (10 mg total) by mouth daily.   hydrOXYzine  (ATARAX ) 10 MG tablet Take 2.5 tablets (25 mg total) by mouth 3 (three) times daily as needed.   lamoTRIgine  (LAMICTAL ) 25 MG tablet Take 1 tablet (25 mg total) by mouth daily.   Probiotic Product (PROBIOTIC DAILY PO) Take by mouth.   Turmeric (QC TUMERIC COMPLEX PO) Take by mouth.   [DISCONTINUED] albuterol  (VENTOLIN  HFA) 108 (90 Base) MCG/ACT inhaler Inhale 2 puffs into the lungs every 6 (six) hours as needed for wheezing or shortness of breath.   No facility-administered medications prior to visit.    Review of Systems  Constitutional: Negative.   HENT: Negative.    Eyes: Negative.   Respiratory:  Positive for cough, sputum production and shortness of breath.   Cardiovascular: Negative.  Negative for chest pain.  Gastrointestinal: Negative.  Negative for  abdominal pain, constipation and diarrhea.  Genitourinary: Negative.   Musculoskeletal:  Negative for joint pain and myalgias.  Skin: Negative.   Neurological: Negative.  Negative for dizziness and headaches.  Endo/Heme/Allergies: Negative.   All other systems reviewed and are negative.      Objective:   BP (!) 166/94   Pulse (!) 128   Ht 5\' 2"  (1.575 m)   Wt 174 lb (78.9 kg)   SpO2 96%   BMI 31.83 kg/m   Vitals:   07/25/23 1325  BP: (!) 166/94  Pulse: (!) 128  Height: 5\' 2"  (1.575 m)  Weight: 174 lb (78.9 kg)  SpO2: 96%  BMI (Calculated): 31.82    Physical Exam Vitals and nursing note reviewed.  Constitutional:      Appearance: Normal appearance. She is normal weight.  HENT:     Head: Normocephalic and atraumatic.      Nose: Nose normal.     Mouth/Throat:     Mouth: Mucous membranes are moist.  Eyes:     Extraocular Movements: Extraocular movements intact.     Conjunctiva/sclera: Conjunctivae normal.     Pupils: Pupils are equal, round, and reactive to light.  Cardiovascular:     Rate and Rhythm: Normal rate and regular rhythm.     Pulses: Normal pulses.     Heart sounds: Normal heart sounds.  Pulmonary:     Effort: Pulmonary effort is normal.     Breath sounds: Normal breath sounds.  Abdominal:     General: Abdomen is flat. Bowel sounds are normal.     Palpations: Abdomen is soft.  Musculoskeletal:        General: Normal range of motion.     Cervical back: Normal range of motion.       Legs:  Skin:    General: Skin is warm and dry.  Neurological:     General: No focal deficit present.     Mental Status: She is alert and oriented to person, place, and time.  Psychiatric:        Mood and Affect: Mood normal.        Behavior: Behavior normal.        Thought Content: Thought content normal.        Judgment: Judgment normal.      No results found for any visits on 07/25/23.  Recent Results (from the past 2160 hours)  Basic metabolic panel     Status: Abnormal   Collection Time: 05/17/23 12:33 AM  Result Value Ref Range   Sodium 142 135 - 145 mmol/L   Potassium 3.8 3.5 - 5.1 mmol/L   Chloride 108 98 - 111 mmol/L   CO2 23 22 - 32 mmol/L   Glucose, Bld 113 (H) 70 - 99 mg/dL    Comment: Glucose reference range applies only to samples taken after fasting for at least 8 hours.   BUN 20 8 - 23 mg/dL   Creatinine, Ser 1.61 0.44 - 1.00 mg/dL   Calcium 8.0 (L) 8.9 - 10.3 mg/dL   GFR, Estimated >09 >60 mL/min    Comment: (NOTE) Calculated using the CKD-EPI Creatinine Equation (2021)    Anion gap 11 5 - 15    Comment: Performed at [facility], [address] [place], [place] [address]  CBC with Differential/Platelet     Status: Abnormal   Collection Time: 05/17/23 12:33 AM   Result Value Ref Range   WBC 9.1 4.0 - 10.5 K/uL   RBC 4.51 3.87 -  5.11 MIL/uL   Hemoglobin 14.7 12.0 - 15.0 g/dL   HCT 16.1 09.6 - 04.5 %   MCV 91.1 80.0 - 100.0 fL   MCH 32.6 26.0 - 34.0 pg   MCHC 35.8 30.0 - 36.0 g/dL   RDW 40.9 81.1 - 91.4 %   Platelets 278 150 - 400 K/uL   nRBC 0.0 0.0 - 0.2 %   Neutrophils Relative % 43 %   Neutro Abs 3.9 1.7 - 7.7 K/uL   Lymphocytes Relative 48 %   Lymphs Abs 4.3 (H) 0.7 - 4.0 K/uL   Monocytes Relative 7 %   Monocytes Absolute 0.7 0.1 - 1.0 K/uL   Eosinophils Relative 2 %   Eosinophils Absolute 0.1 0.0 - 0.5 K/uL   Basophils Relative 0 %   Basophils Absolute 0.0 0.0 - 0.1 K/uL   Immature Granulocytes 0 %   Abs Immature Granulocytes 0.03 0.00 - 0.07 K/uL    Comment: Performed at [facility], [address], [place], [place] [address]  Troponin I (High Sensitivity)     Status: None   Collection Time: 05/17/23 12:33 AM  Result Value Ref Range   Troponin I (High Sensitivity) 6 <18 ng/L    Comment: (NOTE) Elevated high sensitivity troponin I (hsTnI) values and significant  changes across serial measurements may suggest ACS but many other  chronic and acute conditions are known to elevate hsTnI results.  Refer to the "Links" section for chest pain algorithms and additional  guidance. Performed at [facility], [address] [place], [place] [address]   Resp panel by RT-PCR (RSV, Flu A&B, Covid) Anterior Nasal Swab     Status: None   Collection Time: 05/17/23 12:45 AM   Specimen: Anterior Nasal Swab  Result Value Ref Range   SARS Coronavirus 2 by RT PCR NEGATIVE NEGATIVE    Comment: (NOTE) SARS-CoV-2 target nucleic acids are NOT DETECTED.  The SARS-CoV-2 RNA is generally detectable in upper respiratory specimens during the acute phase of infection. The lowest concentration of SARS-CoV-2 viral copies this assay can detect is 138 copies/mL. A negative result does not preclude SARS-Cov-2 infection and should not  be used as the sole basis for treatment or other patient management decisions. A negative result may occur with  improper specimen collection/handling, submission of specimen other than nasopharyngeal swab, presence of viral mutation(s) within the areas targeted by this assay, and inadequate number of viral copies(<138 copies/mL). A negative result must be combined with clinical observations, patient history, and epidemiological information. The expected result is Negative.  Fact Sheet for Patients:  BloggerCourse.com  Fact Sheet for Healthcare Providers:  SeriousBroker.it  This test is no t yet approved or cleared by the [place]  FDA and  has been authorized for detection and/or diagnosis of SARS-CoV-2 by FDA under an Emergency Use Authorization (EUA). This EUA will remain  in effect (meaning this test can be used) for the duration of the COVID-19 declaration under Section 564(b)(1) of the Act, 21 U.S.C.section 360bbb-3(b)(1), unless the authorization is terminated  or revoked sooner.       Influenza A by PCR NEGATIVE NEGATIVE   Influenza B by PCR NEGATIVE NEGATIVE    Comment: (NOTE) The Xpert Xpress SARS-CoV-2/FLU/RSV plus assay is intended as an aid in the diagnosis of influenza from Nasopharyngeal swab specimens and should not be used as a sole basis for treatment. Nasal washings and aspirates are unacceptable for Xpert Xpress SARS-CoV-2/FLU/RSV testing.  Fact Sheet for Patients: BloggerCourse.com  Fact  Sheet for Healthcare Providers: SeriousBroker.it  This test is not yet approved or cleared by the [place]  FDA and has been authorized for detection and/or diagnosis of SARS-CoV-2 by FDA under an Emergency Use Authorization (EUA). This EUA will remain in effect (meaning this test can be used) for the duration of the COVID-19 declaration under Section 564(b)(1) of  the Act, 21 U.S.C. section 360bbb-3(b)(1), unless the authorization is terminated or revoked.     Resp Syncytial Virus by PCR NEGATIVE NEGATIVE    Comment: (NOTE) Fact Sheet for Patients: BloggerCourse.com  Fact Sheet for Healthcare Providers: SeriousBroker.it  This test is not yet approved or cleared by the [place]  FDA and has been authorized for detection and/or diagnosis of SARS-CoV-2 by FDA under an Emergency Use Authorization (EUA). This EUA will remain in effect (meaning this test can be used) for the duration of the COVID-19 declaration under Section 564(b)(1) of the Act, 21 U.S.C. section 360bbb-3(b)(1), unless the authorization is terminated or revoked.  Performed at [facility], [address] [place], [place] [address]   CBC     Status: None   Collection Time: 06/12/23  1:59 PM  Result Value Ref Range   WBC 10.4 4.0 - 10.5 K/uL   RBC 4.44 3.87 - 5.11 MIL/uL   Hemoglobin 14.5 12.0 - 15.0 g/dL   HCT 60.4 54.0 - 98.1 %   MCV 95.0 80.0 - 100.0 fL   MCH 32.7 26.0 - 34.0 pg   MCHC 34.4 30.0 - 36.0 g/dL   RDW 19.1 47.8 - 29.5 %   Platelets 280 150 - 400 K/uL   nRBC 0.0 0.0 - 0.2 %    Comment: Performed at [facility], [address] [place], [place] [address]  Comprehensive metabolic panel     Status: Abnormal   Collection Time: 06/12/23  1:59 PM  Result Value Ref Range   Sodium 137 135 - 145 mmol/L   Potassium 4.0 3.5 - 5.1 mmol/L   Chloride 105 98 - 111 mmol/L   CO2 19 (L) 22 - 32 mmol/L   Glucose, Bld 95 70 - 99 mg/dL    Comment: Glucose reference range applies only to samples taken after fasting for at least 8 hours.   BUN 9 8 - 23 mg/dL   Creatinine, Ser 8.65 0.44 - 1.00 mg/dL   Calcium 9.2 8.9 - 78.4 mg/dL   Total Protein 7.7 6.5 - 8.1 g/dL   Albumin 4.4 3.5 - 5.0 g/dL   AST 696 (H) 15 - 41 U/L   ALT 86 (H) 0 - 44 U/L   Alkaline Phosphatase 93 38 - 126 U/L   Total Bilirubin  0.9 0.0 - 1.2 mg/dL   GFR, Estimated >29 >52 mL/min    Comment: (NOTE) Calculated using the CKD-EPI Creatinine Equation (2021)    Anion gap 13 5 - 15    Comment: Performed at [facility], [address], [place], [place] [address]  Resp panel by RT-PCR (RSV, Flu A&B, Covid) Anterior Nasal Swab     Status: None   Collection Time: 06/12/23  1:59 PM   Specimen: Anterior Nasal Swab  Result Value Ref Range   SARS Coronavirus 2 by RT PCR NEGATIVE NEGATIVE    Comment: (NOTE) SARS-CoV-2 target nucleic acids are NOT DETECTED.  The SARS-CoV-2 RNA is generally detectable in upper respiratory specimens during the acute phase of infection. The lowest concentration of SARS-CoV-2 viral copies this assay can detect is 138 copies/mL. A negative  result does not preclude SARS-Cov-2 infection and should not be used as the sole basis for treatment or other patient management decisions. A negative result may occur with  improper specimen collection/handling, submission of specimen other than nasopharyngeal swab, presence of viral mutation(s) within the areas targeted by this assay, and inadequate number of viral copies(<138 copies/mL). A negative result must be combined with clinical observations, patient history, and epidemiological information. The expected result is Negative.  Fact Sheet for Patients:  BloggerCourse.com  Fact Sheet for Healthcare Providers:  SeriousBroker.it  This test is no t yet approved or cleared by the [place]  FDA and  has been authorized for detection and/or diagnosis of SARS-CoV-2 by FDA under an Emergency Use Authorization (EUA). This EUA will remain  in effect (meaning this test can be used) for the duration of the COVID-19 declaration under Section 564(b)(1) of the Act, 21 U.S.C.section 360bbb-3(b)(1), unless the authorization is terminated  or revoked sooner.       Influenza A by PCR NEGATIVE  NEGATIVE   Influenza B by PCR NEGATIVE NEGATIVE    Comment: (NOTE) The Xpert Xpress SARS-CoV-2/FLU/RSV plus assay is intended as an aid in the diagnosis of influenza from Nasopharyngeal swab specimens and should not be used as a sole basis for treatment. Nasal washings and aspirates are unacceptable for Xpert Xpress SARS-CoV-2/FLU/RSV testing.  Fact Sheet for Patients: BloggerCourse.com  Fact Sheet for Healthcare Providers: SeriousBroker.it  This test is not yet approved or cleared by the [place]  FDA and has been authorized for detection and/or diagnosis of SARS-CoV-2 by FDA under an Emergency Use Authorization (EUA). This EUA will remain in effect (meaning this test can be used) for the duration of the COVID-19 declaration under Section 564(b)(1) of the Act, 21 U.S.C. section 360bbb-3(b)(1), unless the authorization is terminated or revoked.     Resp Syncytial Virus by PCR NEGATIVE NEGATIVE    Comment: (NOTE) Fact Sheet for Patients: BloggerCourse.com  Fact Sheet for Healthcare Providers: SeriousBroker.it  This test is not yet approved or cleared by the [place]  FDA and has been authorized for detection and/or diagnosis of SARS-CoV-2 by FDA under an Emergency Use Authorization (EUA). This EUA will remain in effect (meaning this test can be used) for the duration of the COVID-19 declaration under Section 564(b)(1) of the Act, 21 U.S.C. section 360bbb-3(b)(1), unless the authorization is terminated or revoked.  Performed at [facility], [address] [place], [place] [address]   TSH     Status: None   Collection Time: 06/16/23 10:44 AM  Result Value Ref Range   TSH 1.110 0.450 - 4.500 uIU/mL      Assessment & Plan:  Keep follow up with specialists.  Referral sent to general surgery. Metoprolol  for tachycardia.  Problem List Items Addressed  This Visit       Other   Depression   Tachycardia - Primary   Unilateral inguinal hernia without obstruction or gangrene   Relevant Orders   Ambulatory referral to General Surgery    Return in about 4 weeks (around 08/22/2023).   Total time spent: 25 minutes  Google, NP  07/25/2023   This document may have been prepared by Dragon Voice Recognition software and as such may include unintentional dictation errors.

## 2023-07-31 ENCOUNTER — Telehealth: Payer: Self-pay | Admitting: Professional Counselor

## 2023-07-31 NOTE — Telephone Encounter (Signed)
 Contacted pt about DBT skills group referral. Provided information on group and confirmed pt plan to attend. Scheduled for [facility] to enroll in outpatient therapy services on 08/02/23.

## 2023-08-01 ENCOUNTER — Encounter: Payer: Self-pay | Admitting: Internal Medicine

## 2023-08-01 ENCOUNTER — Ambulatory Visit (INDEPENDENT_AMBULATORY_CARE_PROVIDER_SITE_OTHER): Admitting: Internal Medicine

## 2023-08-01 VITALS — BP 130/90 | HR 100 | Temp 98.0°F | Ht 62.0 in | Wt 178.0 lb

## 2023-08-01 DIAGNOSIS — F17200 Nicotine dependence, unspecified, uncomplicated: Secondary | ICD-10-CM

## 2023-08-01 DIAGNOSIS — R0602 Shortness of breath: Secondary | ICD-10-CM | POA: Diagnosis not present

## 2023-08-01 DIAGNOSIS — Z6832 Body mass index (BMI) 32.0-32.9, adult: Secondary | ICD-10-CM

## 2023-08-01 DIAGNOSIS — R0609 Other forms of dyspnea: Secondary | ICD-10-CM

## 2023-08-01 DIAGNOSIS — R0683 Snoring: Secondary | ICD-10-CM | POA: Diagnosis not present

## 2023-08-01 DIAGNOSIS — J449 Chronic obstructive pulmonary disease, unspecified: Secondary | ICD-10-CM

## 2023-08-01 DIAGNOSIS — G4710 Hypersomnia, unspecified: Secondary | ICD-10-CM

## 2023-08-01 DIAGNOSIS — F17210 Nicotine dependence, cigarettes, uncomplicated: Secondary | ICD-10-CM | POA: Diagnosis not present

## 2023-08-01 DIAGNOSIS — E669 Obesity, unspecified: Secondary | ICD-10-CM

## 2023-08-01 DIAGNOSIS — R5381 Other malaise: Secondary | ICD-10-CM

## 2023-08-01 DIAGNOSIS — G4733 Obstructive sleep apnea (adult) (pediatric): Secondary | ICD-10-CM

## 2023-08-01 MED ORDER — BREZTRI AEROSPHERE 160-9-4.8 MCG/ACT IN AERO: 2.0000 | INHALATION_SPRAY | Freq: Two times a day (BID) | RESPIRATORY_TRACT | Status: DC
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
## 2023-08-01 NOTE — Patient Instructions (Addendum)
 Recommend obtaining Lung Function test to assess for COPD Start Breztri inhaler 2 puffs in a.m. to 5 PM Please rinse mouth after use  Please stop SMOKING!  Referral to Lung Cancer screening program  Avoid Allergens and Irritants Avoid secondhand smoke Avoid SICK contacts Recommend  Masking  when appropriate Recommend Keep up-to-date with vaccinations   Obtain home sleep test Recommend weight loss  Follow-up with general surgery for hernia

## 2023-08-02 ENCOUNTER — Ambulatory Visit: Admitting: Professional Counselor

## 2023-08-02 DIAGNOSIS — F4310 Post-traumatic stress disorder, unspecified: Secondary | ICD-10-CM

## 2023-08-02 DIAGNOSIS — F1090 Alcohol use, unspecified, uncomplicated: Secondary | ICD-10-CM

## 2023-08-02 DIAGNOSIS — F331 Major depressive disorder, recurrent, moderate: Secondary | ICD-10-CM
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
## 2023-08-03 ENCOUNTER — Encounter: Payer: Self-pay | Admitting: General Surgery

## 2023-08-03 ENCOUNTER — Ambulatory Visit: Payer: Self-pay | Admitting: General Surgery

## 2023-08-03 ENCOUNTER — Telehealth: Payer: Self-pay | Admitting: General Surgery

## 2023-08-03 VITALS — BP 151/90 | HR 94 | Ht 62.0 in | Wt 178.0 lb

## 2023-08-03 DIAGNOSIS — K4090 Unilateral inguinal hernia, without obstruction or gangrene, not specified as recurrent: Secondary | ICD-10-CM

## 2023-08-03 HISTORY — DX: Unilateral inguinal hernia, without obstruction or gangrene, not specified as recurrent: K40.90
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
## 2023-08-03 NOTE — Telephone Encounter (Signed)
 Patient has been advised of Pre-Admission date/time, and Surgery date at [facility].  Surgery Date: 08/23/23 Preadmission Testing Date: 08/16/23 (phone 8a-1p)  Patient informed of the scheduling process for surgery and surgery information is given at time of office visit.    Patient has been made aware to call [phone], between 1-3:00pm the day before surgery, to find out what time to arrive for surgery.

## 2023-08-03 NOTE — Progress Notes (Signed)
 Request for Pulmonary clearance has been faxed to Dr Auston Left. Request for Medical clearance has been faxed to Amber Scoggins.

## 2023-08-03 NOTE — Patient Instructions (Addendum)
 We are going to send a clearance request to your Pulmonary and primary care providers to be sure they are ok with you having surgery.   You have chose to have your hernia repaired. This will be done by Dr. Cornel Diesel at [facility].  Please see your (blue) Pre-care information that you have been given today. Our surgery scheduler will call you to verify surgery date and to go over information.   You will need to arrange to be out of work for approximately 1-2 weeks and then you may return with a lifting restriction for 4 more weeks. If you have FMLA or Disability paperwork that needs to be filled out, please have your company fax your paperwork to [phone] or you may drop this by either office. This paperwork will be filled out within 3 days after your surgery has been completed.  You may have a bruise in your groin and also swelling and brusing in your testicle area. You may use ice 4-5 times daily for 15-20 minutes each time. Make sure that you place a barrier between you and the ice pack. To decrease the swelling, you may roll up a bath towel and place it vertically in between your thighs with your testicles resting on the towel. You will want to keep this area elevated as much as possible for several days following surgery.    Inguinal Hernia, Adult Muscles help keep everything in the body in its proper place. But if a weak spot in the muscles develops, something can poke through. That is called a hernia. When this happens in the lower part of the belly (abdomen), it is called an inguinal hernia. (It takes its name from a part of the body in this region called the inguinal canal.) A weak spot in the wall of muscles lets some fat or part of the small intestine bulge through. An inguinal hernia can develop at any age. Men get them more often than women. CAUSES  In adults, an inguinal hernia develops over time. It can be triggered by: Suddenly straining the muscles of the lower  abdomen. Lifting heavy objects. Straining to have a bowel movement. Difficult bowel movements (constipation) can lead to this. Constant coughing. This may be caused by smoking or lung disease. Being overweight. Being pregnant. Working at a job that requires long periods of standing or heavy lifting. Having had an inguinal hernia before. One type can be an emergency situation. It is called a strangulated inguinal hernia. It develops if part of the small intestine slips through the weak spot and cannot get back into the abdomen. The blood supply can be cut off. If that happens, part of the intestine may die. This situation requires emergency surgery. SYMPTOMS  Often, a small inguinal hernia has no symptoms. It is found when a healthcare provider does a physical exam. Larger hernias usually have symptoms.  In adults, symptoms may include: A lump in the groin. This is easier to see when the person is standing. It might disappear when lying down. In men, a lump in the scrotum. Pain or burning in the groin. This occurs especially when lifting, straining or coughing. A dull ache or feeling of pressure in the groin. Signs of a strangulated hernia can include: A bulge in the groin that becomes very painful and tender to the touch. A bulge that turns red or purple. Fever, nausea and vomiting. Inability to have a bowel movement or to pass gas. DIAGNOSIS  To decide if you have  an inguinal hernia, a healthcare provider will probably do a physical examination. This will include asking questions about any symptoms you have noticed. The healthcare provider might feel the groin area and ask you to cough. If an inguinal hernia is felt, the healthcare provider may try to slide it back into the abdomen. Usually no other tests are needed. TREATMENT  Treatments can vary. The size of the hernia makes a difference. Options include: Watchful waiting. This is often suggested if the hernia is small and you have had  no symptoms. No medical procedure will be done unless symptoms develop. You will need to watch closely for symptoms. If any occur, contact your healthcare provider right away. Surgery. This is used if the hernia is larger or you have symptoms. Open surgery. This is usually an outpatient procedure (you will not stay overnight in a hospital). An cut (incision) is made through the skin in the groin. The hernia is put back inside the abdomen. The weak area in the muscles is then repaired by herniorrhaphy or hernioplasty. Herniorrhaphy: in this type of surgery, the weak muscles are sewn back together. Hernioplasty: a patch or mesh is used to close the weak area in the abdominal wall. Laparoscopy. In this procedure, a surgeon makes small incisions. A thin tube with a tiny video camera (called a laparoscope) is put into the abdomen. The surgeon repairs the hernia with mesh by looking with the video camera and using two long instruments. HOME CARE INSTRUCTIONS  After surgery to repair an inguinal hernia: You will need to take pain medicine prescribed by your healthcare provider. Follow all directions carefully. You will need to take care of the wound from the incision. Your activity will be restricted for awhile. This will probably include no heavy lifting for several weeks. You also should not do anything too active for a few weeks. When you can return to work will depend on the type of job that you have. During "watchful waiting" periods, you should: Maintain a healthy weight. Eat a diet high in fiber (fruits, vegetables and whole grains). Drink plenty of fluids to avoid constipation. This means drinking enough water and other liquids to keep your urine clear or pale yellow. Do not lift heavy objects. Do not stand for long periods of time. Quit smoking. This should keep you from developing a frequent cough. SEEK MEDICAL CARE IF:  A bulge develops in your groin area. You feel pain, a burning sensation  or pressure in the groin. This might be worse if you are lifting or straining. You develop a fever of more than 100.5 F (38.1 C). SEEK IMMEDIATE MEDICAL CARE IF:  Pain in the groin increases suddenly. A bulge in the groin gets bigger suddenly and does not go down. For men, there is sudden pain in the scrotum. Or, the size of the scrotum increases. A bulge in the groin area becomes red or purple and is painful to touch. You have nausea or vomiting that does not go away. You feel your heart beating much faster than normal. You cannot have a bowel movement or pass gas. You develop a fever of more than 102.0 F (38.9 C).   This information is not intended to replace advice given to you by your health care provider. Make sure you discuss any questions you have with your health care provider.   Document Released: 08/07/2008 Document Revised: 06/13/2011 Document Reviewed: 09/22/2014 Elsevier Interactive Patient Education [employer]

## 2023-08-04 NOTE — Progress Notes (Unsigned)
 Medical Clearance has been received from [employer], NP. The patient is cleared at Low risk for surgery.

## 2023-08-07 ENCOUNTER — Telehealth: Payer: Self-pay | Admitting: Internal Medicine

## 2023-08-07 NOTE — Telephone Encounter (Signed)
 Pt called to move up PFT test due to needing it for surgical clearance

## 2023-08-08 NOTE — Telephone Encounter (Signed)
 Pt called back to discuss surgical clearance.  Pt declined to wait on hold until I called the office.  She says to please call her back so she can discuss options.

## 2023-08-08 NOTE — Telephone Encounter (Signed)
 Copied from CRM [id number]. Topic: General - Other >> Aug 07, 2023  5:02 PM Alverda Joe S wrote: Reason for CRM: patient was contacted in regards to her pft, it looks like she was called to move up her appointment because it is needed for a surgical clearance before her surgery, please call patient to further discuss  08/08/23 @ 9:06 patient was called voicemail left. Pt is in need of surgical clearance for her surgery scheduled later this month. Doctor is requesting the PFT be done asap so he can have a clear picture.

## 2023-08-11 ENCOUNTER — Encounter

## 2023-08-11 ENCOUNTER — Ambulatory Visit: Admitting: Professional Counselor

## 2023-08-14 ENCOUNTER — Ambulatory Visit: Admitting: Internal Medicine

## 2023-08-14 DIAGNOSIS — J449 Chronic obstructive pulmonary disease, unspecified: Secondary | ICD-10-CM | POA: Diagnosis not present

## 2023-08-14 DIAGNOSIS — R0602 Shortness of breath: Secondary | ICD-10-CM

## 2023-08-14 DIAGNOSIS — F17200 Nicotine dependence, unspecified, uncomplicated: Secondary | ICD-10-CM

## 2023-08-14 LAB — PULMONARY FUNCTION TEST
DL/VA % pred: 114 %
DL/VA: 4.9 ml/min/mmHg/L
DLCO unc % pred: 96 %
DLCO unc: 18.01 ml/min/mmHg
FEF 25-75 Post: 1.15 L/s
FEF 25-75 Pre: 0.89 L/s
FEF2575-%Change-Post: 28 %
FEF2575-%Pred-Post: 52 %
FEF2575-%Pred-Pre: 40 %
FEV1-%Change-Post: 6 %
FEV1-%Pred-Post: 66 %
FEV1-%Pred-Pre: 62 %
FEV1-Post: 1.56 L
FEV1-Pre: 1.47 L
FEV1FVC-%Change-Post: 0 %
FEV1FVC-%Pred-Pre: 89 %
FEV6-%Change-Post: 5 %
FEV6-%Pred-Post: 74 %
FEV6-%Pred-Pre: 70 %
FEV6-Post: 2.19 L
FEV6-Pre: 2.07 L
FEV6FVC-%Change-Post: 0 %
FEV6FVC-%Pred-Post: 102 %
FEV6FVC-%Pred-Pre: 102 %
FVC-%Change-Post: 5 %
FVC-%Pred-Post: 73 %
FVC-%Pred-Pre: 69 %
FVC-Post: 2.22 L
FVC-Pre: 2.11 L
Post FEV1/FVC ratio: 70 %
Post FEV6/FVC ratio: 99 %
Pre FEV1/FVC ratio: 70 %
Pre FEV6/FVC Ratio: 98 %
RV % pred: 115 %
RV: 2.21 L
TLC % pred: 90 %
TLC: 4.3 L

## 2023-08-14 NOTE — Patient Instructions (Signed)
 Full PFT completed today ? ?

## 2023-08-14 NOTE — Progress Notes (Signed)
 Full PFT completed today ? ?

## 2023-08-15 ENCOUNTER — Encounter: Payer: Self-pay | Admitting: Psychiatry

## 2023-08-15 ENCOUNTER — Ambulatory Visit (INDEPENDENT_AMBULATORY_CARE_PROVIDER_SITE_OTHER): Admitting: Psychiatry

## 2023-08-15 VITALS — BP 128/84 | HR 97 | Temp 98.1°F | Ht 62.0 in | Wt 179.2 lb

## 2023-08-15 DIAGNOSIS — F332 Major depressive disorder, recurrent severe without psychotic features: Secondary | ICD-10-CM | POA: Diagnosis not present

## 2023-08-15 DIAGNOSIS — F411 Generalized anxiety disorder: Secondary | ICD-10-CM | POA: Diagnosis not present

## 2023-08-15 MED ORDER — LAMOTRIGINE 25 MG PO TABS: 50.0000 mg | ORAL_TABLET | Freq: Every day | ORAL | 0 refills | Status: DC

## 2023-08-15 MED ORDER — HYDROXYZINE HCL 25 MG PO TABS
25.0000 mg | ORAL_TABLET | Freq: Three times a day (TID) | ORAL | 0 refills | Status: DC | PRN
Start: 2023-08-15 — End: 2023-10-11

## 2023-08-15 NOTE — Addendum Note (Signed)
 Addended by: Minnie Legros on: 08/15/2023 04:30 PM   Modules accepted: Orders

## 2023-08-15 NOTE — Progress Notes (Cosign Needed)
 [facility] MD/PA/NP OP Progress Note  08/15/2023 2:07 PM Molly Greer  MRN:  [id number]  Chief Complaint:  Chief Complaint  Patient presents with   Follow-up   HPI: 63 year old female presents to [facility] for follow-up.  Patient presents stating that she is feeling much better about herself as she has been able to organize caring a planner with all of her appointments.  Patient reports that since she got Medicaid she is able to correct many medical issues that she has had including hernia, spinal issues, as well as cataract issues.  Patient reports that she has been keeping a plan to organize all of these appointments but has been getting overwhelmed at times utilizing hydroxyzine  as needed for anxiety.  Patient reports that she has been taking Lamictal  regularly for the last month and states that she has forgot once or twice per takes as soon as she remembers.  Patient reports that she is very happy with the situation she is and is which she has regular providers that she feels supportive and that her mood and her temperament feels much better.  Patient still endorses the stressor of her daughter being in prison and that she is at risk of losing her daughters in which the children the patient's grandchildren will be placed in foster home.  When describing the situation patient became tearful and reports that she is still not quite settled with the grief of losing the care of her grandchildren a year ago.  Patient was able to be placed in therapy with one of our [employer] providers Nellie Banas but patient was unable to make the appointment due to technical difficulties as this was a virtual appointment.  Patient was educated on how to access her my chart messaging as well as her my chart page to be able to click the video chat button.  Patient showed back how to get her MyChart video and reported that if she feels that this is too complicated that she should consider in person visit.  This does bring up the challenge of  transportation in which she relies on city and Medicaid resources for transportation.  Based on this assessment interview is recommended for the patient to continue medications with the increase of Lamictal  to 50 mg once daily for a month and and hydroxyzine  reminded to only take hydroxyzine  as needed for anxiety.  Patient with no other needs or concerns at this time patient agrees to treatment plan.  Patient denies SI, HI, AVH.  Patient will follow up in 1 month. Visit Diagnosis:    ICD-10-CM   1. Severe episode of recurrent major depressive disorder, without psychotic features (HCC)  F33.2 lamoTRIgine  (LAMICTAL ) 25 MG tablet    2. Generalized anxiety disorder  F41.1 hydrOXYzine  (ATARAX ) 25 MG tablet      Past Psychiatric History:  Previous Psych Hospitalizations: Denies   Outpatient treatment:  -Denies   Medications Current: -Denies Medication Trials: -Denies Suicide & Violence: -Denies SI, HI, AVH.  No previous suicide attempt. Psychotherapy: Denies, patient placed on wait list for [facility] for DBT.   Legal:  Past Medical History:  Past Medical History:  Diagnosis Date   Anxiety    Asthma    Bone spur    right foot   Hypertension    MRSA infection    Stroke [facility])     Past Surgical History:  Procedure Laterality Date   ROTATOR CUFF REPAIR     TUBAL LIGATION      Family Psychiatric History:  Endorses mother  and father using alcohol regularly   Family History: History reviewed. No pertinent family history.  Social History:  Social History   Socioeconomic History   Marital status: Single    Spouse name: Not on file   Number of children: Not on file   Years of education: Not on file   Highest education level: Not on file  Occupational History   Not on file  Tobacco Use   Smoking status: Every Day    Current packs/day: 1.00    Types: Cigarettes    Passive exposure: Past   Smokeless tobacco: Never   Tobacco comments:    In the past 3 months she has  reduced down 0.5 and had previously quit for a year during Covid years.        Chauncy Coral, CMA 08/01/23  Vaping Use   Vaping status: Former  Substance and Sexual Activity   Alcohol use: Yes    Comment: 2 shots liquor each night per pt   Drug use: Yes    Types: Cocaine    Comment: states not now, states no meth   Sexual activity: Not Currently  Other Topics Concern   Not on file  Social History Narrative   Not on file   Social Drivers of Health   Financial Resource Strain: Low Risk  (08/02/2023)   Overall Financial Resource Strain (CARDIA)    Difficulty of Paying Living Expenses: Not very hard  Food Insecurity: No Food Insecurity (08/02/2023)   Hunger Vital Sign    Worried About Running Out of Food in the Last Year: Never true    Ran Out of Food in the Last Year: Never true  Transportation Needs: Unmet Transportation Needs (08/02/2023)   PRAPARE - Transportation    Lack of Transportation (Medical): No    Lack of Transportation (Non-Medical): Yes  Physical Activity: Inactive (08/02/2023)   Exercise Vital Sign    Days of Exercise per Week: 0 days    Minutes of Exercise per Session: 0 min  Stress: Stress Concern Present (08/02/2023)   [employer] of Occupational Health - Occupational Stress Questionnaire    Feeling of Stress : Very much  Social Connections: Socially Isolated (08/02/2023)   Social Connection and Isolation Panel [NHANES]    Frequency of Communication with Friends and Family: Never    Frequency of Social Gatherings with Friends and Family: Never    Attends Religious Services: Never    Database administrator or Organizations: No    Attends Banker Meetings: Never    Marital Status: Living with partner    Allergies:  Allergies  Allergen Reactions   Penicillins Swelling    Metabolic Disorder Labs: No results found for: "HGBA1C", "MPG" No results found for: "PROLACTIN" Lab Results  Component Value Date   CHOL 169 04/09/2011   TRIG 118  04/09/2011   HDL 41 04/09/2011   VLDL 24 04/09/2011   LDLCALC 104 (H) 04/09/2011   Lab Results  Component Value Date   TSH 1.110 06/16/2023   TSH 1.16 04/08/2011    Therapeutic Level Labs: No results found for: "LITHIUM" No results found for: "VALPROATE" No results found for: "CBMZ"  Current Medications: Current Outpatient Medications  Medication Sig Dispense Refill   albuterol  (VENTOLIN  HFA) 108 (90 Base) MCG/ACT inhaler Inhale 2 puffs into the lungs every 6 (six) hours as needed for wheezing or shortness of breath. 18 g 1   baclofen  (LIORESAL ) 10 MG tablet Take 1 tablet (10 mg total) by mouth  daily. 30 tablet 1   Black Pepper-Turmeric (TURMERIC COMPLEX/BLACK PEPPER PO) Take 1,500 mg by mouth daily.     budeson-glycopyrrolate-formoterol (BREZTRI  AEROSPHERE) 160-9-4.8 MCG/ACT AERO inhaler Inhale 2 puffs into the lungs in the morning and at bedtime. (Patient taking differently: Inhale 2 puffs into the lungs daily.)     Green Tea, Camellia sinensis, (GREEN TEA PO) Take 2 capsules by mouth daily.     hydrOXYzine  (ATARAX ) 10 MG tablet Take 2.5 tablets (25 mg total) by mouth 3 (three) times daily as needed. (Patient taking differently: Take 10 mg by mouth at bedtime as needed (itching/ watery eye).) 30 tablet 0   lamoTRIgine  (LAMICTAL ) 25 MG tablet Take 1 tablet (25 mg total) by mouth daily. 30 tablet 0   metoprolol  succinate (TOPROL  XL) 25 MG 24 hr tablet Take 1 tablet (25 mg total) by mouth at bedtime. 30 tablet 11   No current facility-administered medications for this visit.     Musculoskeletal: Strength & Muscle Tone: within normal limits Gait & Station: normal Patient leans: N/A  Psychiatric Specialty Exam: Review of Systems  Constitutional: Negative.   HENT: Negative.    Eyes: Negative.   Respiratory: Negative.    Cardiovascular: Negative.   Gastrointestinal: Negative.   Endocrine: Negative.   Genitourinary: Negative.   Musculoskeletal: Negative.    Allergic/Immunologic: Negative.   Neurological: Negative.   Hematological: Negative.     Blood pressure 128/84, pulse 97, temperature 98.1 F (36.7 C), temperature source Temporal, height 5\' 2"  (1.575 m), weight 179 lb 3.2 oz (81.3 kg), SpO2 95%.Body mass index is 32.78 kg/m.  General Appearance: Well Groomed  Eye Contact:  Good  Speech:  Clear and Coherent  Volume:  Increased  Mood:  Anxious  Affect:  Appropriate  Thought Process:  Coherent  Orientation:  Full (Time, Place, and Person)  Thought Content: Logical   Suicidal Thoughts:  No  Homicidal Thoughts:  No  Memory:  Immediate;   Good Recent;   Good Remote;   Good  Judgement:  Good  Insight:  Good  Psychomotor Activity:  Normal  Concentration:  Concentration: Good and Attention Span: Good  Recall:  Good  Fund of Knowledge: Good  Language: Good  Akathisia:  No  Handed:  Right  AIMS (if indicated):   Assets:  Teaching laboratory technician Vocational/Educational  ADL's:  Intact  Cognition: WNL  Sleep:  Good   Screenings: AUDIT    Advertising copywriter from 08/02/2023 in [facility]  Alcohol Use Disorder Identification Test Final Score (AUDIT) 3      GAD-7    Flowsheet Row Counselor from 08/02/2023 in [facility]  Total GAD-7 Score 4      PHQ2-9    Flowsheet Row Counselor from 08/02/2023 in [facility] Office Visit from 07/19/2023 in [facility] Office Visit from 04/20/2023 in [facility]  PHQ-2 Total Score 4 4 2   PHQ-9 Total Score 12 16 7       Flowsheet Row Counselor from 08/02/2023 in [facility] from 06/12/2023 in [facility] at [facility] ED from 05/16/2023 in [facility] at [facility]  C-SSRS RISK CATEGORY No Risk  No Risk No Risk        Assessment and Plan:  Assessment - Diagnosis: Generalized anxiety disorder [F41.1]  2. Severe episode of recurrent major depressive disorder, without psychotic features (HCC) [F33.2]  Differential Diagnosis: Borderline Personality Disorder  - Progress: Patient arrived to appointment stating that she has improved with her symptoms with the PHQ-9 score of 11 and GAD 7 score of 8.  Patient reports that she is having a lot of medical appointments now for surgeries to correct a lot of medical issues she currently has and is really happy that now that she has Medicaid that she could get although these problems are addressed.  Patient still appears tearful about the happenings of her daughter and the potential of her grandchildren being removed from the care of her daughter and going to the foster home. - Risk Factors: Suicidal risk, worsening symptoms  Plan - Medications:  Increase Lamictal  50 mg once a day for depression.  Patient has been educated on the adverse reaction of rashes patient educated if a new rash develops to notify the clinic and stop the medication.  Patient has also been educated on the GI irritability and initial headache and dizziness and has been asked to wait and see if the symptoms are resolved.  Her last longer than a week please contact clinic Continue hydroxyzine  25 mg 3 times a day as needed for anxiety.  Patient educated that this medication is sedating and not to take out while driving or operating machinery. - Psychotherapy: Patient has been recommended for DBT with Nellie Banas here at [facility] and has been placed on the wait list. - Education: Patient has been educated on her current stressors as well as coping strategies to help improve mood as well as her energy.  Patient instructed to walk outside for 20 minutes if possible to get some sunshine as well as to exercise to promote energy.  Patient also educated on medications with side effects and  adverse reactions. - Follow-Up: Patient will follow up in 1 month - Referrals: No referrals - Safety Planning:  The patient has been educated, if they should have suicidal thoughts with or without a plan to call 911, or go to the closest emergency department.  Pt verbalized understanding.  Pt denies firearms within the home.  Pt also agrees to call the clinic should they have worsening symptoms before the next appointment.    Patient/Guardian was advised Release of Information must be obtained prior to any record release in order to collaborate their care with an outside provider. Patient/Guardian was advised if they have not already done so to contact the registration department to sign all necessary forms in order for us  to release information regarding their care.   Consent: Patient/Guardian gives verbal consent for treatment and assignment of benefits for services provided during this visit. Patient/Guardian expressed understanding and agreed to proceed.   The patient has been educated, if they should have suicidal thoughts with or without a plan to call 911, or go to the closest emergency department.  Pt verbalized understanding.  Pt denies firearms within the home.  Pt also agrees to call the clinic should they have worsening symptoms before the next appointment.    Arlana Labor, NP 08/15/2023, 2:07 PM

## 2023-08-16 ENCOUNTER — Other Ambulatory Visit: Payer: Self-pay

## 2023-08-16 ENCOUNTER — Ambulatory Visit: Payer: Self-pay | Admitting: General Surgery

## 2023-08-16 ENCOUNTER — Encounter
Admission: RE | Admit: 2023-08-16 | Discharge: 2023-08-16 | Disposition: A | Discharge status: Home / Self Care | Source: Ambulatory Visit | Attending: General Surgery | Admitting: General Surgery

## 2023-08-16 DIAGNOSIS — K4090 Unilateral inguinal hernia, without obstruction or gangrene, not specified as recurrent: Secondary | ICD-10-CM

## 2023-08-16 HISTORY — DX: Pneumonia, unspecified organism: J18.9

## 2023-08-16 HISTORY — DX: Depression, unspecified: F32.A

## 2023-08-16 HISTORY — DX: Cocaine use, unspecified, uncomplicated: F14.90

## 2023-08-16 HISTORY — DX: Generalized anxiety disorder: F41.1

## 2023-08-16 NOTE — Patient Instructions (Addendum)
 Your procedure is scheduled on: Wednesday, May 21 Report to the Registration Desk on the 1st floor of the [facility] To find out your arrival time, please call [phone] between 1PM - 3PM on: Tuesday, May 20 If your arrival time is 6:00 am, do not arrive before that time as the [facility] entrance doors do not open until 6:00 am.  REMEMBER: Instructions that are not followed completely may result in serious medical risk, up to and including death; or upon the discretion of your surgeon and anesthesiologist your surgery may need to be rescheduled.  Do not eat or drink after midnight the night before surgery.  No gum chewing or hard candies.  You may however, drink CLEAR liquids up to 2 hours before you are scheduled to arrive for your surgery. Do not drink anything within 2 hours of your scheduled arrival time.  One week prior to surgery: starting May 14 Stop Anti-inflammatories (NSAIDS) such as Advil , Aleve, Ibuprofen , Motrin , Naproxen, Naprosyn and Aspirin based products such as Excedrin, Goody's Powder, BC Powder. Stop ANY OVER THE COUNTER supplements until after surgery. Stop turmeric, green tea.  You may however, continue to take Tylenol  if needed for pain up until the day of surgery.  Continue taking all of your other prescription medications up until the day of surgery.  ON THE DAY OF SURGERY ONLY TAKE THESE MEDICATIONS WITH SIPS OF WATER:  Breztri  inhaler Lamotrigine  (Lamictal ) Hydroxyzine  if needed for anxiety  Use inhaler on the day of surgery and bring your albuterol  inhaler to the hospital.  No Alcohol for 24 hours before or after surgery.  No Smoking including e-cigarettes for 24 hours before surgery.  No chewable tobacco products for at least 6 hours before surgery.  No nicotine  patches on the day of surgery.  Do not use any "recreational" drugs for at least a week (preferably 2 weeks) before your surgery.  Please be advised that the combination of cocaine  and anesthesia may have negative outcomes, up to and including death. If you test positive for cocaine, your surgery will be cancelled.  On the morning of surgery brush your teeth with toothpaste and water, you may rinse your mouth with mouthwash if you wish. Do not swallow any toothpaste or mouthwash.  Use CHG Soap as directed on instruction sheet.  Do not wear jewelry, make-up, hairpins, clips or nail polish.  For welded (permanent) jewelry: bracelets, anklets, waist bands, etc.  Please have this removed prior to surgery.  If it is not removed, there is a chance that hospital personnel will need to cut it off on the day of surgery.  Do not wear lotions, powders, or perfumes.   Do not shave body hair from the neck down 48 hours before surgery.  Contact lenses, hearing aids and dentures may not be worn into surgery.  Do not bring valuables to the hospital. [facility] is not responsible for any missing/lost belongings or valuables.   Notify your doctor if there is any change in your medical condition (cold, fever, infection).  Wear comfortable clothing (specific to your surgery type) to the hospital.  After surgery, you can help prevent lung complications by doing breathing exercises.  Take deep breaths and cough every 1-2 hours. Your doctor may order a device called an Incentive Spirometer to help you take deep breaths. When coughing or sneezing, hold a pillow firmly against your incision with both hands. This is called "splinting." Doing this helps protect your incision. It also decreases belly  discomfort.  If you are being discharged the day of surgery, you will not be allowed to drive home. You will need a responsible individual to drive you home and stay with you for 24 hours after surgery.   If you are taking public transportation, you will need to have a responsible individual with you.  Please call the Pre-admissions Testing Dept. at [phone] if you have any questions  about these instructions.  Surgery Visitation Policy:  Patients having surgery or a procedure may have two visitors.  Children under the age of 66 must have an adult with them who is not the patient.      Preparing for Surgery with CHLORHEXIDINE GLUCONATE (CHG) Soap  Chlorhexidine Gluconate (CHG) Soap  o An antiseptic cleaner that kills germs and bonds with the skin to continue killing germs even after washing  o Used for showering the night before surgery and morning of surgery  Before surgery, you can play an important role by reducing the number of germs on your skin.  CHG (Chlorhexidine gluconate) soap is an antiseptic cleanser which kills germs and bonds with the skin to continue killing germs even after washing.  Please do not use if you have an allergy to CHG or antibacterial soaps. If your skin becomes reddened/irritated stop using the CHG.  1. Shower the NIGHT BEFORE SURGERY and the MORNING OF SURGERY with CHG soap.  2. If you choose to wash your hair, wash your hair first as usual with your normal shampoo.  3. After shampooing, rinse your hair and body thoroughly to remove the shampoo.  4. Use CHG as you would any other liquid soap. You can apply CHG directly to the skin and wash gently with a scrungie or a clean washcloth.  5. Apply the CHG soap to your body only from the neck down. Do not use on open wounds or open sores. Avoid contact with your eyes, ears, mouth, and genitals (private parts). Wash face and genitals (private parts) with your normal soap.  6. Wash thoroughly, paying special attention to the area where your surgery will be performed.  7. Thoroughly rinse your body with warm water.  8. Do not shower/wash with your normal soap after using and rinsing off the CHG soap.  9. Pat yourself dry with a clean towel.  10. Wear clean pajamas to bed the night before surgery.  12. Place clean sheets on your bed the night of your first shower and do not sleep  with pets.  13. Shower again with the CHG soap on the day of surgery prior to arriving at the hospital.  14. Do not apply any deodorants/lotions/powders.  15. Please wear clean clothes to the hospital.

## 2023-08-18 ENCOUNTER — Ambulatory Visit: Admitting: Professional Counselor

## 2023-08-18 DIAGNOSIS — F331 Major depressive disorder, recurrent, moderate: Secondary | ICD-10-CM

## 2023-08-18 DIAGNOSIS — F411 Generalized anxiety disorder: Secondary | ICD-10-CM | POA: Diagnosis not present

## 2023-08-18 NOTE — Progress Notes (Signed)
  THERAPIST PROGRESS NOTE  Virtual Visit via Video Note  I connected with Molly Greer on 08/18/23 at 12:00 PM EDT by a video enabled telemedicine application and verified that I am speaking with the correct person using two identifiers.  Location: Patient: Home Provider: Remote office    I discussed the limitations of evaluation and management by telemedicine and the availability of in person appointments. The patient expressed understanding and agreed to proceed.   I discussed the assessment and treatment plan with the patient. The patient was provided an opportunity to ask questions and all were answered. The patient agreed with the plan and demonstrated an understanding of the instructions.   The patient was advised to call back or seek an in-person evaluation if the symptoms worsen or if the condition fails to improve as anticipated.  I provided 53 minutes of non-face-to-face time during this encounter. Len Quale, [facility]  Session Time: 12:00 PM -12:53 PM   Participation Level: Active  Behavioral Response: Casual, Alert, Dysphoric  Type of Therapy: Group Therapy  Interventions: CBT and Supportive  Summary: Molly Greer is a 63 y.o. female who presents with a history of anxiety, depression, and PTSD. She appeared dysphoric but oriented x5. She stated her week was tough because her daughter lost her parental rights. [name] was able to see one of her grandchildren though. [name] was active and engaged in group discussion. She reported her goal for this week is to continue using her planner to stay productive and get outside of her bedroom more.   Therapist Response: Conducted telehealth group session. Began session with introductions and review of group rules and expectations. Engaged group members in mindfulness exercise - guided meditation for mindfulness. Conducted brief check-in with each group member. Reviewed handouts and worksheets - Mindfulness module - Goals and WISE  MIND. Passenger transport manager with each group member and goal setting between now and next session.   Suicidal/Homicidal: No  Diagnosis: Major depressive disorder, recurrent episode, moderate (HCC)  Generalized anxiety disorder  Patient/Guardian was advised Release of Information must be obtained prior to any record release in order to collaborate their care with an outside provider. Patient/Guardian was advised if they have not already done so to contact the registration department to sign all necessary forms in order for us  to release information regarding their care.   Consent: Patient/Guardian gives verbal consent for treatment and assignment of benefits for services provided during this visit. Patient/Guardian expressed understanding and agreed to proceed.   Len Quale, [facility] 08/18/2023

## 2023-08-21 ENCOUNTER — Ambulatory Visit

## 2023-08-21 DIAGNOSIS — G4733 Obstructive sleep apnea (adult) (pediatric): Secondary | ICD-10-CM

## 2023-08-22 ENCOUNTER — Telehealth: Payer: Self-pay

## 2023-08-22 MED ORDER — CEFAZOLIN SODIUM-DEXTROSE 2-4 GM/100ML-% IV SOLN
2.0000 g | INTRAVENOUS | Status: AC
  Administered 2023-08-23: 2 g via INTRAVENOUS

## 2023-08-22 MED ORDER — CHLORHEXIDINE GLUCONATE CLOTH 2 % EX PADS: 6.0000 | MEDICATED_PAD | Freq: Once | CUTANEOUS | Status: DC

## 2023-08-22 MED ORDER — LACTATED RINGERS IV SOLN
INTRAVENOUS | Status: DC
Start: 2023-08-22 — End: 2023-08-23

## 2023-08-22 MED ORDER — CHLORHEXIDINE GLUCONATE 0.12 % MT SOLN: 15.0000 mL | Freq: Once | OROMUCOSAL | Status: DC

## 2023-08-22 MED ORDER — ORAL CARE MOUTH RINSE: 15.0000 mL | Freq: Once | OROMUCOSAL | Status: DC

## 2023-08-22 NOTE — Telephone Encounter (Signed)
 I called Molly Greer and ask her to resend the surgery clearance request. She stated they received the same form along with records earlier today. I am giving the form to the CMA

## 2023-08-22 NOTE — Progress Notes (Unsigned)
 Pulmonary Clearance has been received from Dr Cleve Dale. Preoperative Pulmonary risk assessment.  Patient has Moderate/High risk for post op Pulmonary Complications. All notes are in Epic.

## 2023-08-22 NOTE — Telephone Encounter (Signed)
 Copied from CRM [id number]. Topic: Medical Record Request - Records Request >> Aug 22, 2023 10:19 AM Isabell A wrote: Reason for CRM: Deadra Everts from [facility] calling in regard to surgical clearance form - patient hasnt been cleared yet & she is scheduled for surgery tomorrow 5/21.  Callback number: [phone] Fax no: [phone]

## 2023-08-23 ENCOUNTER — Ambulatory Visit: Admitting: General Practice

## 2023-08-23 ENCOUNTER — Ambulatory Visit
Admission: RE | Admit: 2023-08-23 | Discharge: 2023-08-23 | Disposition: A | Discharge status: Home / Self Care | Attending: General Surgery | Admitting: General Surgery

## 2023-08-23 ENCOUNTER — Other Ambulatory Visit: Payer: Self-pay

## 2023-08-23 ENCOUNTER — Encounter: Payer: Self-pay | Admitting: General Surgery

## 2023-08-23 ENCOUNTER — Encounter
Admission: RE | Disposition: A | Discharge status: Home / Self Care | Payer: Self-pay | Source: Home / Self Care | Attending: General Surgery

## 2023-08-23 DIAGNOSIS — Z79899 Other long term (current) drug therapy: Secondary | ICD-10-CM | POA: Diagnosis not present

## 2023-08-23 DIAGNOSIS — J4489 Other specified chronic obstructive pulmonary disease: Secondary | ICD-10-CM | POA: Diagnosis not present

## 2023-08-23 DIAGNOSIS — F419 Anxiety disorder, unspecified: Secondary | ICD-10-CM | POA: Diagnosis not present

## 2023-08-23 DIAGNOSIS — K4090 Unilateral inguinal hernia, without obstruction or gangrene, not specified as recurrent: Secondary | ICD-10-CM | POA: Diagnosis present

## 2023-08-23 DIAGNOSIS — Z8673 Personal history of transient ischemic attack (TIA), and cerebral infarction without residual deficits: Secondary | ICD-10-CM | POA: Insufficient documentation

## 2023-08-23 DIAGNOSIS — I10 Essential (primary) hypertension: Secondary | ICD-10-CM | POA: Insufficient documentation

## 2023-08-23 DIAGNOSIS — F17210 Nicotine dependence, cigarettes, uncomplicated: Secondary | ICD-10-CM | POA: Diagnosis not present

## 2023-08-23 HISTORY — PX: HERNIORRHAPHY, INGUINAL, ROBOT-ASSISTED, LAPAROSCOPIC: SHX7585

## 2023-08-23 SURGERY — HERNIORRHAPHY, INGUINAL, ROBOT-ASSISTED, LAPAROSCOPIC
Anesthesia: General | Site: Inguinal | Laterality: Right

## 2023-08-23 MED ORDER — OXYCODONE HCL 5 MG/5ML PO SOLN: 5.0000 mg | Freq: Once | ORAL | Status: AC | PRN

## 2023-08-23 MED ORDER — SUGAMMADEX SODIUM 200 MG/2ML IV SOLN
INTRAVENOUS | Status: DC | PRN
  Administered 2023-08-23: 200 mg via INTRAVENOUS

## 2023-08-23 MED ORDER — CHLORHEXIDINE GLUCONATE 0.12 % MT SOLN
OROMUCOSAL | Status: AC
  Filled 2023-08-23: qty 15

## 2023-08-23 MED ORDER — MIDAZOLAM HCL 2 MG/2ML IJ SOLN
INTRAMUSCULAR | Status: AC
  Filled 2023-08-23: qty 2

## 2023-08-23 MED ORDER — BUPIVACAINE LIPOSOME 1.3 % IJ SUSP
INTRAMUSCULAR | Status: AC
  Filled 2023-08-23: qty 10

## 2023-08-23 MED ORDER — PROPOFOL 10 MG/ML IV BOLUS
INTRAVENOUS | Status: AC
  Filled 2023-08-23: qty 20

## 2023-08-23 MED ORDER — LIDOCAINE HCL (CARDIAC) PF 100 MG/5ML IV SOSY
PREFILLED_SYRINGE | INTRAVENOUS | Status: DC | PRN
  Administered 2023-08-23: 100 mg via INTRAVENOUS

## 2023-08-23 MED ORDER — FENTANYL CITRATE (PF) 100 MCG/2ML IJ SOLN
INTRAMUSCULAR | Status: DC | PRN
  Administered 2023-08-23 (×2): 50 ug via INTRAVENOUS

## 2023-08-23 MED ORDER — FENTANYL CITRATE (PF) 100 MCG/2ML IJ SOLN
25.0000 ug | INTRAMUSCULAR | Status: DC | PRN
  Administered 2023-08-23 (×3): 25 ug via INTRAVENOUS

## 2023-08-23 MED ORDER — FENTANYL CITRATE (PF) 100 MCG/2ML IJ SOLN
INTRAMUSCULAR | Status: AC
  Filled 2023-08-23: qty 2

## 2023-08-23 MED ORDER — KETOROLAC TROMETHAMINE 30 MG/ML IJ SOLN
INTRAMUSCULAR | Status: DC | PRN
  Administered 2023-08-23: 30 mg via INTRAVENOUS

## 2023-08-23 MED ORDER — BUPIVACAINE-EPINEPHRINE (PF) 0.5% -1:200000 IJ SOLN
INTRAMUSCULAR | Status: AC
  Filled 2023-08-23: qty 10

## 2023-08-23 MED ORDER — PHENYLEPHRINE 80 MCG/ML (10ML) SYRINGE FOR IV PUSH (FOR BLOOD PRESSURE SUPPORT)
PREFILLED_SYRINGE | INTRAVENOUS | Status: DC | PRN
  Administered 2023-08-23: 80 ug via INTRAVENOUS

## 2023-08-23 MED ORDER — CEFAZOLIN SODIUM-DEXTROSE 2-4 GM/100ML-% IV SOLN
INTRAVENOUS | Status: AC
  Filled 2023-08-23: qty 100

## 2023-08-23 MED ORDER — ROCURONIUM BROMIDE 100 MG/10ML IV SOLN
INTRAVENOUS | Status: DC | PRN
  Administered 2023-08-23: 60 mg via INTRAVENOUS
  Administered 2023-08-23 (×2): 10 mg via INTRAVENOUS

## 2023-08-23 MED ORDER — OXYCODONE HCL 5 MG PO TABS
5.0000 mg | ORAL_TABLET | Freq: Once | ORAL | Status: AC | PRN
  Administered 2023-08-23: 5 mg via ORAL

## 2023-08-23 MED ORDER — DEXAMETHASONE SODIUM PHOSPHATE 10 MG/ML IJ SOLN
INTRAMUSCULAR | Status: DC | PRN
  Administered 2023-08-23: 10 mg via INTRAVENOUS

## 2023-08-23 MED ORDER — LABETALOL HCL 5 MG/ML IV SOLN
INTRAVENOUS | Status: DC | PRN
  Administered 2023-08-23: 5 mg via INTRAVENOUS

## 2023-08-23 MED ORDER — BUPIVACAINE-EPINEPHRINE (PF) 0.5% -1:200000 IJ SOLN
INTRAMUSCULAR | Status: DC | PRN
  Administered 2023-08-23: 20 mL via INTRAMUSCULAR

## 2023-08-23 MED ORDER — OXYCODONE HCL 5 MG PO TABS: 5.0000 mg | ORAL_TABLET | Freq: Four times a day (QID) | ORAL | 0 refills | Status: DC | PRN

## 2023-08-23 MED ORDER — PROPOFOL 10 MG/ML IV BOLUS
INTRAVENOUS | Status: DC | PRN
  Administered 2023-08-23: 160 mg via INTRAVENOUS

## 2023-08-23 MED ORDER — OXYCODONE HCL 5 MG PO TABS
ORAL_TABLET | ORAL | Status: AC
  Filled 2023-08-23: qty 1

## 2023-08-23 MED ORDER — ONDANSETRON HCL 4 MG/2ML IJ SOLN
INTRAMUSCULAR | Status: DC | PRN
  Administered 2023-08-23: 4 mg via INTRAVENOUS

## 2023-08-23 MED ORDER — MIDAZOLAM HCL 2 MG/2ML IJ SOLN
INTRAMUSCULAR | Status: DC | PRN
  Administered 2023-08-23: 4 mg via INTRAVENOUS

## 2023-08-23 MED ORDER — ACETAMINOPHEN 10 MG/ML IV SOLN
INTRAVENOUS | Status: DC | PRN
  Administered 2023-08-23: 1000 mg via INTRAVENOUS

## 2023-08-23 SURGICAL SUPPLY — 38 items
BAG PRESSURE INF REUSE 1000 (BAG) IMPLANT
COVER TIP SHEARS 8 DVNC (MISCELLANEOUS) ×1 IMPLANT
COVER WAND RF STERILE (DRAPES) ×1 IMPLANT
DERMABOND ADVANCED .7 DNX12 (GAUZE/BANDAGES/DRESSINGS) ×1 IMPLANT
DRAPE ARM DVNC X/XI (DISPOSABLE) ×3 IMPLANT
DRAPE COLUMN DVNC XI (DISPOSABLE) ×1 IMPLANT
DRIVER NDL LRG 8 DVNC XI (INSTRUMENTS) ×1 IMPLANT
DRIVER NDLE LRG 8 DVNC XI (INSTRUMENTS) ×1 IMPLANT
ELECTRODE REM PT RTRN 9FT ADLT (ELECTROSURGICAL) ×1 IMPLANT
FORCEPS BPLR FENES DVNC XI (FORCEP) ×1 IMPLANT
FORCEPS BPLR R/ABLATION 8 DVNC (INSTRUMENTS) ×1 IMPLANT
GLOVE BIOGEL PI IND STRL 7.5 (GLOVE) ×2 IMPLANT
GLOVE SURG SYN 7.0 (GLOVE) ×2 IMPLANT
GLOVE SURG SYN 7.0 PF PI (GLOVE) ×2 IMPLANT
GOWN STRL REUS W/ TWL LRG LVL3 (GOWN DISPOSABLE) ×3 IMPLANT
IRRIGATOR SUCT 8 DISP DVNC XI (IRRIGATION / IRRIGATOR) IMPLANT
IV NS 1000ML BAXH (IV SOLUTION) IMPLANT
KIT PINK PAD W/HEAD ARE REST (MISCELLANEOUS) ×1 IMPLANT
KIT PINK PAD W/HEAD ARM REST (MISCELLANEOUS) ×1 IMPLANT
LABEL OR SOLS (LABEL) IMPLANT
MANIFOLD NEPTUNE II (INSTRUMENTS) ×1 IMPLANT
MESH 3DMAX 4X6 RT LRG (Mesh General) IMPLANT
NDL HYPO 22X1.5 SAFETY MO (MISCELLANEOUS) ×1 IMPLANT
NDL INSUFFLATION 14GA 120MM (NEEDLE) ×1 IMPLANT
NEEDLE HYPO 22X1.5 SAFETY MO (MISCELLANEOUS) ×1 IMPLANT
NEEDLE INSUFFLATION 14GA 120MM (NEEDLE) ×1 IMPLANT
OBTURATOR OPTICALSTD 8 DVNC (TROCAR) ×1 IMPLANT
PACK LAP CHOLECYSTECTOMY (MISCELLANEOUS) ×1 IMPLANT
SCISSORS MNPLR CVD DVNC XI (INSTRUMENTS) ×1 IMPLANT
SEAL UNIV 5-12 XI (MISCELLANEOUS) ×3 IMPLANT
SET TUBE SMOKE EVAC HIGH FLOW (TUBING) ×1 IMPLANT
SOLUTION ELECTROSURG ANTI STCK (MISCELLANEOUS) ×1 IMPLANT
SUT STRATA 2-0 23CM CT-2 (SUTURE) ×1 IMPLANT
SUT VIC AB 2-0 SH 27XBRD (SUTURE) ×1 IMPLANT
SUTURE MNCRL 4-0 27XMF (SUTURE) ×1 IMPLANT
TAPE TRANSPORE STRL 2 31045 (GAUZE/BANDAGES/DRESSINGS) IMPLANT
TRAP FLUID SMOKE EVACUATOR (MISCELLANEOUS) ×1 IMPLANT
WATER STERILE IRR 500ML POUR (IV SOLUTION) ×1 IMPLANT

## 2023-08-23 NOTE — Anesthesia Preprocedure Evaluation (Signed)
 Anesthesia Evaluation  Patient identified by MRN, date of birth, ID band Patient awake    Reviewed: Allergy & Precautions, NPO status , Patient's Chart, lab work & pertinent test results  Airway Mallampati: III  TM Distance: >3 FB Neck ROM: full    Dental  (+) Chipped, Poor Dentition   Pulmonary COPD,  COPD inhaler, Current Smoker and Patient abstained from smoking.   Pulmonary exam normal        Cardiovascular hypertension, On Medications negative cardio ROS Normal cardiovascular exam     Neuro/Psych  PSYCHIATRIC DISORDERS Anxiety Depression    CVA, No Residual Symptoms    GI/Hepatic negative GI ROS, Neg liver ROS,,,  Endo/Other  negative endocrine ROS    Renal/GU      Musculoskeletal   Abdominal   Peds  Hematology negative hematology ROS (+)   Anesthesia Other Findings Past Medical History: No date: Asthma No date: Bone spur     Comment:  right foot No date: Cocaine use No date: Depression No date: Generalized anxiety disorder No date: Hypertension No date: MRSA infection No date: Pneumonia 08/2023: Right inguinal hernia No date: Stroke [facility])  Past Surgical History: No date: ROTATOR CUFF REPAIR; Left No date: TUBAL LIGATION  BMI    Body Mass Index: 31.09 kg/m      Reproductive/Obstetrics negative OB ROS                             Anesthesia Physical Anesthesia Plan  ASA: 3  Anesthesia Plan: General ETT   Post-op Pain Management:    Induction: Intravenous  PONV Risk Score and Plan: 3 and Ondansetron, Dexamethasone  and Midazolam  Airway Management Planned: Oral ETT  Additional Equipment:   Intra-op Plan:   Post-operative Plan: Extubation in OR  Informed Consent: I have reviewed the patients History and Physical, chart, labs and discussed the procedure including the risks, benefits and alternatives for the proposed anesthesia with the patient or authorized  representative who has indicated his/her understanding and acceptance.     Dental Advisory Given  Plan Discussed with: Anesthesiologist, CRNA and Surgeon  Anesthesia Plan Comments: (Patient consented for risks of anesthesia including but not limited to:  - adverse reactions to medications - damage to eyes, teeth, lips or other oral mucosa - nerve damage due to positioning  - sore throat or hoarseness - Damage to heart, brain, nerves, lungs, other parts of body or loss of life  Patient voiced understanding and assent.)        Anesthesia Quick Evaluation

## 2023-08-23 NOTE — Transfer of Care (Signed)
 Immediate Anesthesia Transfer of Care Note  Patient: Molly Greer  Procedure(s) Performed: HERNIORRHAPHY, INGUINAL, ROBOT-ASSISTED, LAPAROSCOPIC (Right: Inguinal)  Patient Location: PACU  Anesthesia Type:General  Level of Consciousness: awake, alert , and oriented  Airway & Oxygen Therapy: Patient Spontanous Breathing and Patient connected to face mask oxygen  Post-op Assessment: Report given to RN and Patient moving all extremities  Post vital signs: Reviewed and stable  Last Vitals:  Vitals Value Taken Time  BP 115/77 08/23/23 1148  Temp    Pulse 80 08/23/23 1148  Resp 19 08/23/23 1148  SpO2 100 % 08/23/23 1148  Vitals shown include unfiled device data.  Last Pain:  Vitals:   08/23/23 0822  TempSrc: Temporal  PainSc: 0-No pain      Patients Stated Pain Goal: 0 (08/23/23 1610)  Complications: No notable events documented.

## 2023-08-23 NOTE — Anesthesia Procedure Notes (Signed)
 Procedure Name: Intubation Date/Time: 08/23/2023 9:38 AM  Performed by: Ezzard Holms, CRNAPre-anesthesia Checklist: Patient identified, Patient being monitored, Timeout performed, Emergency Drugs available and Suction available Patient Re-evaluated:Patient Re-evaluated prior to induction Oxygen Delivery Method: Circle system utilized Preoxygenation: Pre-oxygenation with 100% oxygen Induction Type: IV induction Ventilation: Mask ventilation without difficulty Laryngoscope Size: Mac, 3 and McGrath Grade View: Grade I Tube type: Oral Tube size: 7.0 mm Number of attempts: 1 Airway Equipment and Method: Stylet Placement Confirmation: ETT inserted through vocal cords under direct vision, positive ETCO2 and breath sounds checked- equal and bilateral Secured at: 21 cm Tube secured with: Tape Dental Injury: Teeth and Oropharynx as per pre-operative assessment

## 2023-08-23 NOTE — Op Note (Signed)
 Procedure Date:  08/23/2023   Pre-operative Diagnosis:  Right Inguinal Hernia  Post-operative Diagnosis: Right Direct Inguinal Hernia  Procedure: 1.  Robotic assisted Rightr Inguinal Hernia Repair 2.  Creation of Right Posterior Rectus-Transversalis Fascia Advancment Flap for Coverage of Pelvic Wound (200 cm)  Surgeon:  Severa Daniels, M.D.   Anesthesia:  General endotracheal  Estimated Blood Loss:  15 ml  Specimens:  None  Complications:  None  Indications for Procedure:  This is a 63 y.o. female who presents with a right inguinal hernia.  The options of surgery versus observation were reviewed with the patient and/or family. The risks of bleeding, abscess or infection, recurrence of symptoms, potential for an open procedure, injury to surrounding structures, and chronic pain were all discussed with the patient and she was willing to proceed.  We have planned this transabdominal procedure with the creation of right peritoneal flap based on the posterior rectus sheath and transversalis fascia in order to fully cover the mesh, creating a natural tisssue barrier for the bowel and peritoneal cavity.  Description of Procedure: The patient was correctly identified in the preoperative area and brought into the operating room.  The patient was placed supine with VTE prophylaxis in place.  Appropriate time-outs were performed.  Anesthesia was induced and the patient was intubated.   Appropriate antibiotics were infused.  The abdomen was prepped and draped in a sterile fashion. An incision was made at [place] and a veress needle was placed into the abdomen using standard drop technique. Pneumoperitoneum was then established. Using an optiview trocar a supra-umbilical robotic port was placed. No injury was noted at veress needle insertion site. Two 8-mm robotic ports were placed in the right and left lateral positions under direct visualization.    The [employer] platform was docked onto the  patient, the camera was inserted and targeted, and the instruments were placed under direct visualization.  Both inguinal regions were inspected for hernias and it was confirmed that the patient had a right inguinal hernia.  Using electocautery, the peritoneal and posterior rectus tissue flap was created.  The peritoneum on the right side was scored from the median umbilical ligament laterally towards the ASIS.  The flap was mobilized using robotic scissors and the bipolar instruments, creating a plane along the posterior rectus sheath and transversalis fascia down to the pubic tubercle medially. It was then further mobilized laterally across the inguinal canal and femoral vessels and onto the psoas muscle. The inferior epigastric vessels were identified and preserved. This created a posterior rectus and peritoneal flap measuring roughly 17 cm x 12 cm.  The hernia sac and contents were reduced preserving all structures. The patient had a right  direct hernia defect. The round ligament was dissected out and ligated with cautery. A large pro-grip polypropelene mesh was then inserted into the abdomen along with a 2-0 v-lock suture. The mesh was placed into the pre-peritoneal space and there was good coverage of all hernia spaces. Then, the peritoneal flap was advanced over the mesh and carried over to close the defect. A running 2-0 V lock suture was used to approximate the edge of the flap onto the peritoneum.  All needles were removed under direct visualization.  The 8- mm ports were removed under direct visualization and the Hasson trocar was removed.   Local anesthetic was infused in all incisions  and the incisions were closed with 4-0 Monocryl.  The wounds were cleaned and sealed with DermaBond. The patient was emerged  from anesthesia and extubated and brought to the recovery room for further management.  The patient tolerated the procedure well and all counts were correct at the end of the  case.   Severa Daniels, M.D.

## 2023-08-23 NOTE — Anesthesia Postprocedure Evaluation (Signed)
 Anesthesia Post Note  Patient: Molly Greer  Procedure(s) Performed: HERNIORRHAPHY, INGUINAL, ROBOT-ASSISTED, LAPAROSCOPIC (Right: Inguinal)  Patient location during evaluation: PACU Anesthesia Type: General Level of consciousness: awake and alert Pain management: pain level controlled Vital Signs Assessment: post-procedure vital signs reviewed and stable Respiratory status: spontaneous breathing, nonlabored ventilation, respiratory function stable and patient connected to nasal cannula oxygen Cardiovascular status: blood pressure returned to baseline and stable Postop Assessment: no apparent nausea or vomiting Anesthetic complications: no  No notable events documented.   Last Vitals:  Vitals:   08/23/23 1145 08/23/23 1200  BP: 115/77 100/62  Pulse: 75 80  Resp: 19 19  Temp: 36.4 C   SpO2: 98% 93%    Last Pain:  Vitals:   08/23/23 1145  TempSrc:   PainSc: Asleep                 Enrique Harvest
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
## 2023-08-24 ENCOUNTER — Ambulatory Visit: Admitting: Cardiology

## 2023-08-24 ENCOUNTER — Encounter: Payer: Self-pay | Admitting: General Surgery

## 2023-08-25 ENCOUNTER — Ambulatory Visit: Admitting: Professional Counselor

## 2023-08-25 DIAGNOSIS — F331 Major depressive disorder, recurrent, moderate: Secondary | ICD-10-CM | POA: Diagnosis not present

## 2023-08-25 DIAGNOSIS — F411 Generalized anxiety disorder: Secondary | ICD-10-CM | POA: Diagnosis not present

## 2023-08-25 NOTE — Progress Notes (Addendum)
  THERAPIST PROGRESS NOTE  Virtual Visit via Video Note  I connected with Jeaneen Mike on 08/25/23 at 12:00 PM EDT by a video enabled telemedicine application and verified that I am speaking with the correct person using two identifiers.  Location: Patient: Home Provider: Remote office   I discussed the limitations of evaluation and management by telemedicine and the availability of in person appointments. The patient expressed understanding and agreed to proceed.   I discussed the assessment and treatment plan with the patient. The patient was provided an opportunity to ask questions and all were answered. The patient agreed with the plan and demonstrated an understanding of the instructions.   The patient was advised to call back or seek an in-person evaluation if the symptoms worsen or if the condition fails to improve as anticipated.  I provided 58 minutes of non-face-to-face time during this encounter. Len Quale, [facility]  Session Time: 12:00 PM - 12:58 PM  Participation Level: Active  Behavioral Response: Casual, Alert, Euthymic  Type of Therapy: Group Therapy  Interventions: CBT and Supportive  Summary: Elham Fini is a 63 y.o. female who presents with a history of depression and substance use. [name] appeared alert and oriented x5. She stated she is doing well after having hernia surgery two days ago. She reported improvement with sleep as she has been practicing breathing exercises. She also worked on her goal of getting out more and noted things she's done this week. [name] reported her goal for this week is to continue practicing mindfulness skills and particularly wants to try "Leaves on a Stream" meditation.   Therapist Response: Conducted telehealth group session. Began session with introductions and review of group rules and expectations. Engaged group members in mindfulness exercise - guided meditation for "[employer] on a [employer] brief check-in with  each group member. Reviewed handouts and worksheets - Mindfulness module - "What" skills - Observe, Describe, and Participate. Passenger transport manager with each group member and goal setting between now and next session.   Suicidal/Homicidal: No  Diagnosis: Major depressive disorder, recurrent episode, moderate (HCC)  Generalized anxiety disorder  Patient/Guardian was advised Release of Information must be obtained prior to any record release in order to collaborate their care with an outside provider. Patient/Guardian was advised if they have not already done so to contact the registration department to sign all necessary forms in order for us  to release information regarding their care.   Consent: Patient/Guardian gives verbal consent for treatment and assignment of benefits for services provided during this visit. Patient/Guardian expressed understanding and agreed to proceed.   Len Quale, [facility] 08/25/2023

## 2023-09-01 ENCOUNTER — Ambulatory Visit: Admitting: Professional Counselor

## 2023-09-01 DIAGNOSIS — F4310 Post-traumatic stress disorder, unspecified: Secondary | ICD-10-CM | POA: Diagnosis not present

## 2023-09-01 DIAGNOSIS — F411 Generalized anxiety disorder: Secondary | ICD-10-CM | POA: Diagnosis not present

## 2023-09-01 DIAGNOSIS — F331 Major depressive disorder, recurrent, moderate: Secondary | ICD-10-CM

## 2023-09-01 NOTE — Progress Notes (Signed)
  THERAPIST PROGRESS NOTE  Virtual Visit via Video Note  I connected with Molly Greer on 09/01/23 at 12:00 PM EDT by a video enabled telemedicine application and verified that I am speaking with the correct person using two identifiers.  Location: Patient: Home Provider: Remote office   I discussed the limitations of evaluation and management by telemedicine and the availability of in person appointments. The patient expressed understanding and agreed to proceed.   I discussed the assessment and treatment plan with the patient. The patient was provided an opportunity to ask questions and all were answered. The patient agreed with the plan and demonstrated an understanding of the instructions.   The patient was advised to call back or seek an in-person evaluation if the symptoms worsen or if the condition fails to improve as anticipated.  I provided 60 minutes of non-face-to-face time during this encounter. Len Quale, [facility]  Session Time: 12:00 PM - 1:00 PM  Participation Level: Active  Behavioral Response: Well GroomedAlertDysphoric  Type of Therapy: Group Therapy  Interventions: CBT and Supportive  Summary: Molly Greer is a 63 y.o. female who presents with a history of anxiety, depression, and PTSD. She appeared dysphoric but oriented x5. She stated between her recovery from surgery and the rainy weather, she's been struggled with more depressive symptoms. [name] reported she was able to practice the leaves meditation since last session and she used the imagery of the trees/leaves outside her window. She noted her goal for this week is to practice breaking down chores in th house so she can complete them.   Therapist Response: Conducted telehealth group session. Began session with introductions and review of group rules and expectations. Engaged group members in mindfulness exercise - guided meditation "Leaves on a Stream." Conducted brief check-in with each group member.  Reviewed handouts and worksheets - Mindfulness module - "How" skills - Nonjudgmentally, one-mindfully, and effectively. Passenger transport manager with each group member and goal setting between now and next session.   Suicidal/Homicidal: No  Diagnosis: Major depressive disorder, recurrent episode, moderate (HCC)  Generalized anxiety disorder  PTSD (post-traumatic stress disorder)  Patient/Guardian was advised Release of Information must be obtained prior to any record release in order to collaborate their care with an outside provider. Patient/Guardian was advised if they have not already done so to contact the registration department to sign all necessary forms in order for us  to release information regarding their care.   Consent: Patient/Guardian gives verbal consent for treatment and assignment of benefits for services provided during this visit. Patient/Guardian expressed understanding and agreed to proceed.   Len Quale, [facility] 09/01/2023

## 2023-09-05 ENCOUNTER — Telehealth: Payer: Self-pay

## 2023-09-05 ENCOUNTER — Other Ambulatory Visit: Payer: Self-pay | Admitting: Psychiatry

## 2023-09-05 DIAGNOSIS — R0602 Shortness of breath: Secondary | ICD-10-CM

## 2023-09-05 DIAGNOSIS — J449 Chronic obstructive pulmonary disease, unspecified: Secondary | ICD-10-CM

## 2023-09-05 DIAGNOSIS — F332 Major depressive disorder, recurrent severe without psychotic features: Secondary | ICD-10-CM

## 2023-09-05 MED ORDER — BREZTRI AEROSPHERE 160-9-4.8 MCG/ACT IN AERO: 2.0000 | INHALATION_SPRAY | Freq: Two times a day (BID) | RESPIRATORY_TRACT | 6 refills | Status: DC

## 2023-09-05 MED ORDER — LAMOTRIGINE 25 MG PO TABS: 50.0000 mg | ORAL_TABLET | Freq: Every day | ORAL | 0 refills | Status: DC

## 2023-09-05 NOTE — Telephone Encounter (Signed)
 PA request has been Received. New Encounter has been or will be created for follow up. For additional info see Pharmacy Prior Auth telephone encounter from 06/03.

## 2023-09-05 NOTE — Telephone Encounter (Signed)
 Copied from CRM [id number]. Topic: Clinical - Prescription Issue >> Sep 05, 2023  1:44 PM Isabell A wrote: Reason for CRM: Patient states her pharmacy is requiring signature authorization for delivery since this is the first time this medication has been prescribed.   budesonide-glycopyrrolate-formoterol (BREZTRI  AEROSPHERE) 160-9-4.8 MCG/ACT AERO inhaler    [facility] - [place], [place] - [address] [place] [address] Phone: [phone]  Fax: [phone]

## 2023-09-05 NOTE — Telephone Encounter (Signed)
 received fax requesting a month supply of the lamictal  instead of 15 day supply. pt was last seen on 5-3 next appt 6-11

## 2023-09-05 NOTE — Telephone Encounter (Signed)
*  Pulm  Pharmacy Patient Advocate Encounter   Received notification from CoverMyMeds that prior authorization for Breztri  Aerosphere 160-9-4.8MCG/ACT aerosol  is required/requested.   Insurance verification completed.   The patient is insured through [employer] .   Per test claim:  Trial and failure of TWO preferred alternatives (Symbicort, Advair Diskus, Advair HFA, Dulera) is preferred by the insurance.  If suggested medication is appropriate, Please send in a new RX and discontinue this one. If not, please advise as to why it's not appropriate so that we may request a Prior Authorization. Please note, some preferred medications may still require a PA.  If the suggested medications have not been trialed and there are no contraindications to their use, the PA will not be submitted, as it will not be approved.   CMM Key: [id number]

## 2023-09-05 NOTE — Telephone Encounter (Signed)
 Copied from CRM [id number]. Topic: Clinical - Prescription Issue >> Sep 05, 2023 10:37 AM Crist Dominion wrote: Reason for CRM: Patient is requesting Dr. Auston Left to please send her prescription budeson-glycopyrrolate-formoterol (BREZTRI  AEROSPHERE) 160-9-4.8 MCG/ACT AERO inhaler to her preferred pharmacy ([facility])  Patient is requesting this be sent today so that her pharmacy can deliver this afternoon.

## 2023-09-06 ENCOUNTER — Other Ambulatory Visit: Payer: Self-pay | Admitting: Internal Medicine

## 2023-09-06 DIAGNOSIS — J449 Chronic obstructive pulmonary disease, unspecified: Secondary | ICD-10-CM

## 2023-09-06 MED ORDER — FLUTICASONE-SALMETEROL 230-21 MCG/ACT IN AERO: 2.0000 | INHALATION_SPRAY | Freq: Two times a day (BID) | RESPIRATORY_TRACT | 12 refills | Status: DC

## 2023-09-06 NOTE — Telephone Encounter (Signed)
 I have notified the patient. Nothing further needed.

## 2023-09-06 NOTE — Telephone Encounter (Signed)
 Copied from CRM [id number]. Topic: Clinical - Prescription Issue >> Sep 06, 2023 11:03 AM Isabell A wrote: Reason for CRM: Mandy from [facility] states for fluticasone-salmeterol (ADVAIR HFA) 230-21 MCG/ACT inhaler the quantity needs to be 12 not 1.  Prescription needs to be resent.

## 2023-09-06 NOTE — Progress Notes (Signed)
 Advair HFA ordered

## 2023-09-06 NOTE — Telephone Encounter (Signed)
 Aaron Aas

## 2023-09-08 ENCOUNTER — Ambulatory Visit (INDEPENDENT_AMBULATORY_CARE_PROVIDER_SITE_OTHER): Admitting: Professional Counselor

## 2023-09-08 DIAGNOSIS — F4310 Post-traumatic stress disorder, unspecified: Secondary | ICD-10-CM

## 2023-09-08 DIAGNOSIS — F331 Major depressive disorder, recurrent, moderate: Secondary | ICD-10-CM | POA: Diagnosis not present

## 2023-09-08 DIAGNOSIS — F411 Generalized anxiety disorder: Secondary | ICD-10-CM | POA: Diagnosis not present

## 2023-09-09 NOTE — Progress Notes (Signed)
  THERAPIST PROGRESS NOTE  Virtual Visit via Video Note  I connected with Molly Greer on 09/09/23 at 12:00 PM EDT by a video enabled telemedicine application and verified that I am speaking with the correct person using two identifiers.  Location: Patient: Home Provider: Remote office   I discussed the limitations of evaluation and management by telemedicine and the availability of in person appointments. The patient expressed understanding and agreed to proceed.   I discussed the assessment and treatment plan with the patient. The patient was provided an opportunity to ask questions and all were answered. The patient agreed with the plan and demonstrated an understanding of the instructions.   The patient was advised to call back or seek an in-person evaluation if the symptoms worsen or if the condition fails to improve as anticipated.  I provided 44 minutes of non-face-to-face time during this encounter. Len Quale, [facility]  Session Time: 12:00 PM - 12:44 PM  Participation Level: Active  Behavioral Response: Well Groomed, Alert, Dysphoric  Type of Therapy: Group Therapy  Interventions: CBT and Supportive  Summary: Molly Greer is a 63 y.o. female who presents with a history of anxiety, depression, and PTSD. She appeared alert and oriented x5. She stated working on her goal from last week was "challenging." She was receptive to giving herself grace due to still being on surgery restrictions. She reported her goal for this week is to practice the exercise discussed in session - practicing loving kindness.   Therapist Response: Conducted telehealth group session. Began session with introductions and review of group rules and expectations. Engaged group members in mindfulness exercise - guided meditation for nonjudgmental. Conducted brief check-in with each group member. Reviewed handouts and worksheets - Mindfulness module - Mindfulness from a spiritual perspective. Systems developer with each group member and goal setting between now and next session.   Suicidal/Homicidal: No  Diagnosis: Major depressive disorder, recurrent episode, moderate (HCC)  Generalized anxiety disorder  PTSD (post-traumatic stress disorder)  Patient/Guardian was advised Release of Information must be obtained prior to any record release in order to collaborate their care with an outside provider. Patient/Guardian was advised if they have not already done so to contact the registration department to sign all necessary forms in order for us  to release information regarding their care.   Consent: Patient/Guardian gives verbal consent for treatment and assignment of benefits for services provided during this visit. Patient/Guardian expressed understanding and agreed to proceed.   Len Quale, [facility] 09/09/2023

## 2023-09-12 ENCOUNTER — Encounter

## 2023-09-12 ENCOUNTER — Encounter: Payer: Self-pay | Admitting: General Surgery

## 2023-09-12 ENCOUNTER — Ambulatory Visit (INDEPENDENT_AMBULATORY_CARE_PROVIDER_SITE_OTHER): Admitting: General Surgery

## 2023-09-12 VITALS — BP 167/94 | HR 118 | Temp 97.7°F | Ht 62.0 in | Wt 180.8 lb

## 2023-09-12 DIAGNOSIS — K4090 Unilateral inguinal hernia, without obstruction or gangrene, not specified as recurrent: Secondary | ICD-10-CM

## 2023-09-12 NOTE — Patient Instructions (Signed)

## 2023-09-13 ENCOUNTER — Ambulatory Visit (INDEPENDENT_AMBULATORY_CARE_PROVIDER_SITE_OTHER): Admitting: Psychiatry

## 2023-09-13 ENCOUNTER — Encounter: Payer: Self-pay | Admitting: Psychiatry

## 2023-09-13 ENCOUNTER — Other Ambulatory Visit: Payer: Self-pay

## 2023-09-13 VITALS — BP 147/86 | HR 103 | Temp 95.9°F | Ht 62.0 in | Wt 184.2 lb

## 2023-09-13 DIAGNOSIS — F4310 Post-traumatic stress disorder, unspecified: Secondary | ICD-10-CM | POA: Diagnosis not present

## 2023-09-13 DIAGNOSIS — F331 Major depressive disorder, recurrent, moderate: Secondary | ICD-10-CM

## 2023-09-13 DIAGNOSIS — F411 Generalized anxiety disorder: Secondary | ICD-10-CM

## 2023-09-13 MED ORDER — LAMOTRIGINE 100 MG PO TABS: 100.0000 mg | ORAL_TABLET | Freq: Every day | ORAL | 2 refills | Status: DC

## 2023-09-13 NOTE — Progress Notes (Addendum)
 BH MD/PA/NP OP Progress Note  09/13/2023 10:49 AM Molly Greer  MRN:  [id number]  Chief Complaint:  Chief Complaint  Patient presents with   Follow-up   HPI: 63 year old female presenting to [facility] for follow-up.  Patient presents stating that she is getting all of her medical issues addressed stating that she recently had a hernia repair and as well as has sleep apnea monitor with her stating that she has to do another sleep apnea test.  Patient walks and stating that she is having difficulties with the relationship with her partner who lives with her in her home stating that he is very demanding stating that he is expecting all of the house chores to be done as well as a clean house when he gets home.  Patient reports that she is feeling emotionally disconnected and stating she is not getting any kind of emotional support or physical support from her partner when she is going through these medical challenges.  Patient reports medications are going well and states that she is getting enough sleep 6 to 8 hours a night.  Patient also endorses a good appetite stating that she is still waiting for her spinal surgeon to schedule her appointment stating that Medicaid has been helping her get all of her health concerns addressed.  Patient also reports that she is doing well in therapy with Josiephine Nightingale here at [facility] stating that she is completely invested in the therapy and is looking forward to the next sessions. This on this assessment interview is recommended for the patient to increase Lamictal  to 100 mg as patient is not having any side effects and is reporting a good management of her major depressive disorder.  Patient also continuing to use hydroxyzine  3 times a day as needed 25 mg.  Patient with no other needs or concerns at this time's patient will continue following up with therapy weekly.  Patient is in agreement with treatment plan.  Patient will follow up in 1 month due to medication  adjustment. Visit Diagnosis:    ICD-10-CM   1. Major depressive disorder, recurrent episode, moderate (HCC)  F33.1     2. Generalized anxiety disorder  F41.1     3. PTSD (post-traumatic stress disorder)  F43.10       Past Psychiatric History:  Previous Psych Hospitalizations:  -Denies   Outpatient treatment:  -Denies   Medications Current: - Lamictal  100mg  once daily - Hydroxyzine  25mg  TID, for anxiety.   Medication Trials: -Denies  Suicide & Violence: -Denies SI, HI, AVH.  No previous suicide attempt.  Psychotherapy: -Currently in therapy with Josiephine Nightingale at [facility].   Legal: -Denies  Past Medical History:  Past Medical History:  Diagnosis Date   Asthma    Bone spur    right foot   Cocaine use    Depression    Generalized anxiety disorder    Hypertension    MRSA infection    Pneumonia    Right inguinal hernia 08/2023   Stroke [facility])     Past Surgical History:  Procedure Laterality Date   HERNIORRHAPHY, INGUINAL, ROBOT-ASSISTED, LAPAROSCOPIC Right 08/23/2023   Procedure: HERNIORRHAPHY, INGUINAL, ROBOT-ASSISTED, LAPAROSCOPIC;  Surgeon: Barrett Lick, MD;  Location: [facility] ORS;  Service: General;  Laterality: Right;  with mesh   ROTATOR CUFF REPAIR Left    TUBAL LIGATION      Family Psychiatric History: No additional  Family History: History reviewed. No pertinent family history.  Social History:  Social History   Socioeconomic History  Marital status: Single    Spouse name: Not on file   Number of children: 3   Years of education: Not on file   Highest education level: Not on file  Occupational History   Not on file  Tobacco Use   Smoking status: Every Day    Current packs/day: 0.50    Types: Cigarettes    Passive exposure: Past   Smokeless tobacco: Never   Tobacco comments:    In the past 3 months she has reduced down 0.5 and had previously quit for a year during Covid years.        Chauncy Coral, CMA 08/01/23  Vaping Use   Vaping  status: Former   Substances: Flavoring  Substance and Sexual Activity   Alcohol use: Yes    Comment: occassional   Drug use: Yes    Types: Cocaine    Comment: states not now, states no meth   Sexual activity: Not Currently  Other Topics Concern   Not on file  Social History Narrative   Lives with partner   Social Drivers of Health   Financial Resource Strain: Low Risk  (08/02/2023)   Overall Financial Resource Strain (CARDIA)    Difficulty of Paying Living Expenses: Not very hard  Food Insecurity: No Food Insecurity (08/02/2023)   Hunger Vital Sign    Worried About Running Out of Food in the Last Year: Never true    Ran Out of Food in the Last Year: Never true  Transportation Needs: Unmet Transportation Needs (08/02/2023)   PRAPARE - Transportation    Lack of Transportation (Medical): No    Lack of Transportation (Non-Medical): Yes  Physical Activity: Inactive (08/02/2023)   Exercise Vital Sign    Days of Exercise per Week: 0 days    Minutes of Exercise per Session: 0 min  Stress: Stress Concern Present (08/02/2023)   [employer] of Occupational Health - Occupational Stress Questionnaire    Feeling of Stress : Very much  Social Connections: Socially Isolated (08/02/2023)   Social Connection and Isolation Panel [NHANES]    Frequency of Communication with Friends and Family: Never    Frequency of Social Gatherings with Friends and Family: Never    Attends Religious Services: Never    Database administrator or Organizations: No    Attends Banker Meetings: Never    Marital Status: Living with partner    Allergies:  Allergies  Allergen Reactions   Penicillins Swelling    Throat swelling    Metabolic Disorder Labs: No results found for: HGBA1C, MPG No results found for: PROLACTIN Lab Results  Component Value Date   CHOL 169 04/09/2011   TRIG 118 04/09/2011   HDL 41 04/09/2011   VLDL 24 04/09/2011   LDLCALC 104 (H) 04/09/2011   Lab Results   Component Value Date   TSH 1.110 06/16/2023   TSH 1.16 04/08/2011    Therapeutic Level Labs: No results found for: LITHIUM No results found for: VALPROATE No results found for: CBMZ  Current Medications: Current Outpatient Medications  Medication Sig Dispense Refill   baclofen  (LIORESAL ) 10 MG tablet Take 10 mg by mouth daily.     albuterol  (VENTOLIN  HFA) 108 (90 Base) MCG/ACT inhaler Inhale 2 puffs into the lungs every 6 (six) hours as needed for wheezing or shortness of breath. 18 g 1   Black Pepper-Turmeric (TURMERIC COMPLEX/BLACK PEPPER PO) Take 1,500 mg by mouth daily.     fluticasone -salmeterol (ADVAIR HFA) 230-21 MCG/ACT inhaler Inhale 2  puffs into the lungs 2 (two) times daily. 12 g 12   Green Tea, Camellia sinensis, (GREEN TEA PO) Take 2 capsules by mouth daily.     hydrOXYzine  (ATARAX ) 25 MG tablet Take 1 tablet (25 mg total) by mouth 3 (three) times daily as needed. 90 tablet 0   lamoTRIgine  (LAMICTAL ) 25 MG tablet Take 2 tablets (50 mg total) by mouth daily. 60 tablet 0   metoprolol  succinate (TOPROL  XL) 25 MG 24 hr tablet Take 1 tablet (25 mg total) by mouth at bedtime. 30 tablet 11   No current facility-administered medications for this visit.     Musculoskeletal: Strength & Muscle Tone: within normal limits Gait & Station: normal Patient leans: N/A  Psychiatric Specialty Exam: Review of Systems  Constitutional: Negative.   HENT: Negative.    Eyes: Negative.   Respiratory: Negative.    Cardiovascular: Negative.   Gastrointestinal: Negative.   Endocrine: Negative.   Genitourinary: Negative.   Musculoskeletal:  Positive for myalgias.  Skin: Negative.   Allergic/Immunologic: Negative.   Neurological: Negative.   Hematological: Negative.   Psychiatric/Behavioral:  Positive for dysphoric mood. The patient is nervous/anxious.     Blood pressure (!) 147/86, pulse (!) 103, temperature (!) 95.9 F (35.5 C), temperature source Temporal, height 5' 2 (1.575  m), weight 184 lb 3.2 oz (83.6 kg).Body mass index is 33.69 kg/m.  General Appearance: Well Groomed  Eye Contact:  Good  Speech:  Clear and Coherent  Volume:  Normal  Mood:  Anxious and Depressed  Affect:  Appropriate  Thought Process:  Coherent  Orientation:  Full (Time, Place, and Person)  Thought Content: Logical   Suicidal Thoughts:  No  Homicidal Thoughts:  No  Memory:  Immediate;   Good Recent;   Good Remote;   Good  Judgement:  Good  Insight:  Good  Psychomotor Activity:  Normal  Concentration:  Concentration: Good and Attention Span: Good  Recall:  Good  Fund of Knowledge: Good  Language: Good  Akathisia:  No  Handed:  Right  AIMS (if indicated):   Assets:  Desire for Improvement Financial Resources/Insurance Housing  ADL's:  Intact  Cognition: WNL  Sleep:  Good   Screenings: AUDIT    Advertising copywriter from 08/02/2023 in [facility]  Alcohol Use Disorder Identification Test Final Score (AUDIT) 3      GAD-7    Flowsheet Row Counselor from 08/02/2023 in [facility]  Total GAD-7 Score 4      PHQ2-9    Flowsheet Row Office Visit from 08/15/2023 in [facility] Counselor from 08/02/2023 in [facility] Office Visit from 07/19/2023 in [facility] Office Visit from 04/20/2023 in [facility]  PHQ-2 Total Score 2 4 4 2   PHQ-9 Total Score 11 12 16 7       Flowsheet Row Admission (Discharged) from 08/23/2023 in [facility] Counselor from 08/02/2023 in [facility] ED from 06/12/2023 in [facility] at [facility]  C-SSRS RISK CATEGORY No Risk No Risk No Risk       Assessment and Plan:  Assessment - Diagnosis: Generalized anxiety disorder  [F41.1]  2. Severe episode of recurrent major depressive disorder, without psychotic features (HCC) [F33.2]    Differential Diagnosis: Borderline Personality Disorder  - Progress: Patient arrived to appointment stating that she has improved with her  symptoms with the PHQ-9 score of 11 and GAD 7 score of 8.  Patient reports that she is having a lot of medical appointments now for surgeries to correct a lot of medical issues she currently has and is really happy that now that she has Medicaid that she could get although these problems are addressed.  Patient still appears tearful about the happenings of her daughter and the potential of her grandchildren being removed from the care of her daughter and going to the foster home. - Risk Factors: Suicidal risk, worsening symptoms  Plan - Medications:  Increase Lamictal  100 mg once a day for depression.  Patient has been educated on the adverse reaction of rashes patient educated if a new rash develops to notify the clinic and stop the medication.  Patient has also been educated on the GI irritability and initial headache and dizziness and has been asked to wait and see if the symptoms are resolved.  Her last longer than a week please contact clinic Continue hydroxyzine  25 mg 3 times a day as needed for anxiety.  Patient educated that this medication is sedating and not to take out while driving or operating machinery. - Psychotherapy: Pt actively participating with Josiephine Nightingale for therapy.  - Education: Patient has been educated on her current stressors as well as coping strategies to help improve mood as well as her energy.  Patient instructed to walk outside for 20 minutes if possible to get some sunshine as well as to exercise to promote energy.  Patient also educated on medications with side effects and adverse reactions. - Follow-Up: Patient will follow up in 1 month - Referrals: No referrals - Safety Planning:  The patient has been educated, if  they should have suicidal thoughts with or without a plan to call 911, or go to the closest emergency department.  Pt verbalized understanding.  Pt denies firearms within the home.  Pt also agrees to call the clinic should they have worsening symptoms before the next appointment.     Patient/Guardian was advised Release of Information must be obtained prior to any record release in order to collaborate their care with an outside provider. Patient/Guardian was advised if they have not already done so to contact the registration department to sign all necessary forms in order for us  to release information regarding their care.   Consent: Patient/Guardian gives verbal consent for treatment and assignment of benefits for services provided during this visit. Patient/Guardian expressed understanding and agreed to proceed.    Arlana Labor, NP 09/13/2023, 10:49 AM

## 2023-09-15 ENCOUNTER — Ambulatory Visit (INDEPENDENT_AMBULATORY_CARE_PROVIDER_SITE_OTHER): Admitting: Professional Counselor

## 2023-09-15 DIAGNOSIS — F331 Major depressive disorder, recurrent, moderate: Secondary | ICD-10-CM

## 2023-09-15 DIAGNOSIS — F4310 Post-traumatic stress disorder, unspecified: Secondary | ICD-10-CM | POA: Diagnosis not present

## 2023-09-15 DIAGNOSIS — F411 Generalized anxiety disorder: Secondary | ICD-10-CM

## 2023-09-17 NOTE — Progress Notes (Signed)
  THERAPIST PROGRESS NOTE  Virtual Visit via Video Note  I connected with Molly Greer on 09/17/23 at 12:00 PM EDT by a video enabled telemedicine application and verified that I am speaking with the correct person using two identifiers.  Location: Patient: Home Provider: Remote office   I discussed the limitations of evaluation and management by telemedicine and the availability of in person appointments. The patient expressed understanding and agreed to proceed.   I discussed the assessment and treatment plan with the patient. The patient was provided an opportunity to ask questions and all were answered. The patient agreed with the plan and demonstrated an understanding of the instructions.   The patient was advised to call back or seek an in-person evaluation if the symptoms worsen or if the condition fails to improve as anticipated.  I provided 65 minutes of non-face-to-face time during this encounter. Len Quale, [facility]  Session Time: 12:00 PM - 1:05 PM  Participation Level: Active  Behavioral Response: Casual, Alert, Dysphoric  Type of Therapy: Group Therapy  Interventions: CBT and Supportive  Summary: Molly Greer is a 63 y.o. female who presents with a history of anxiety, depression, and PTSD. She appeared somber but oriented x5. She was happy to have received her paper copy of the handouts/worksheets from group. [name] was able to complete her goal of practicing loving kindness last week. She reported her goal for this week is to not to do to others as they did to me.   Therapist Response: Conducted telehealth group session. Began session with introductions and review of group rules and expectations. Engaged group members in mindfulness exercise - guided meditation for loving kindness. Conducted brief check-in with each group member. Reviewed handouts and worksheets - Interpersonal effectiveness - Goals, Myths, and DEAR MAN. Passenger transport manager with each group  member and goal setting between now and next session.   Suicidal/Homicidal: No  Diagnosis: Major depressive disorder, recurrent episode, moderate (HCC)  Generalized anxiety disorder  PTSD (post-traumatic stress disorder)  Patient/Guardian was advised Release of Information must be obtained prior to any record release in order to collaborate their care with an outside provider. Patient/Guardian was advised if they have not already done so to contact the registration department to sign all necessary forms in order for us  to release information regarding their care.   Consent: Patient/Guardian gives verbal consent for treatment and assignment of benefits for services provided during this visit. Patient/Guardian expressed understanding and agreed to proceed.   Len Quale, [facility] 09/17/2023

## 2023-09-19 ENCOUNTER — Ambulatory Visit: Admitting: Cardiology

## 2023-09-22 ENCOUNTER — Ambulatory Visit: Admitting: Professional Counselor

## 2023-09-22 DIAGNOSIS — F4310 Post-traumatic stress disorder, unspecified: Secondary | ICD-10-CM

## 2023-09-22 DIAGNOSIS — F331 Major depressive disorder, recurrent, moderate: Secondary | ICD-10-CM | POA: Diagnosis not present

## 2023-09-22 DIAGNOSIS — G4733 Obstructive sleep apnea (adult) (pediatric): Secondary | ICD-10-CM | POA: Diagnosis not present

## 2023-09-22 DIAGNOSIS — F411 Generalized anxiety disorder: Secondary | ICD-10-CM | POA: Diagnosis not present

## 2023-09-24 NOTE — Progress Notes (Signed)
  THERAPIST PROGRESS NOTE  Virtual Visit via Video Note  I connected with Molly Greer on 09/24/23 at 12:00 PM EDT by a video enabled telemedicine application and verified that I am speaking with the correct person using two identifiers.  Location: Patient: Home Provider: Remote office   I discussed the limitations of evaluation and management by telemedicine and the availability of in person appointments. The patient expressed understanding and agreed to proceed.   I discussed the assessment and treatment plan with the patient. The patient was provided an opportunity to ask questions and all were answered. The patient agreed with the plan and demonstrated an understanding of the instructions.   The patient was advised to call back or seek an in-person evaluation if the symptoms worsen or if the condition fails to improve as anticipated.  I provided 60 minutes of non-face-to-face time during this encounter. [name], [facility]  Session Time: 12:00 PM - 1:00 PM  Participation Level: Active  Behavioral Response: Well Groomed, Alert, Dysphoric  Type of Therapy: Group therapy   Interventions: CBT and Supportive  Summary: Molly Greer is a 63 y.o. female who presents with a history of anxiety, depression, and PTSD. She appeared dysphoric but oriented x5 She stated this week has been up and down. She reported she is working on being less rigid. She identified her goal for the week as validating others and avoiding comparisons.   Therapist Response: Conducted telehealth group session. Began session with introductions and review of group rules and expectations. Engaged group members in mindfulness exercise - guided meditation for setting boundaries. Conducted brief check-in with each group member. Reviewed handouts and worksheets - Interpersonal effectiveness - GIVE skill. Passenger transport manager with each group member and goal setting between now and next session.    Suicidal/Homicidal: No  Diagnosis: Major depressive disorder, recurrent episode, moderate (HCC)  Generalized anxiety disorder  PTSD (post-traumatic stress disorder)  Patient/Guardian was advised Release of Information must be obtained prior to any record release in order to collaborate their care with an outside provider. Patient/Guardian was advised if they have not already done so to contact the registration department to sign all necessary forms in order for us  to release information regarding their care.   Consent: Patient/Guardian gives verbal consent for treatment and assignment of benefits for services provided during this visit. Patient/Guardian expressed understanding and agreed to proceed.   [name], [facility] 09/24/2023

## 2023-09-26 ENCOUNTER — Ambulatory Visit: Admitting: Internal Medicine

## 2023-09-26 ENCOUNTER — Encounter

## 2023-09-26 ENCOUNTER — Encounter: Payer: Self-pay | Admitting: General Surgery

## 2023-09-28 ENCOUNTER — Ambulatory Visit: Admitting: Cardiology

## 2023-09-29 ENCOUNTER — Ambulatory Visit (INDEPENDENT_AMBULATORY_CARE_PROVIDER_SITE_OTHER): Admitting: Professional Counselor

## 2023-09-29 DIAGNOSIS — F4310 Post-traumatic stress disorder, unspecified: Secondary | ICD-10-CM | POA: Diagnosis not present

## 2023-09-29 DIAGNOSIS — F411 Generalized anxiety disorder: Secondary | ICD-10-CM

## 2023-09-29 DIAGNOSIS — F331 Major depressive disorder, recurrent, moderate: Secondary | ICD-10-CM

## 2023-10-01 NOTE — Progress Notes (Signed)
 Mrs. Molly Greer returns status post right inguinal hernia repair with mesh.  She is concerned because there is some fullness to the area.  She says that she has been coughing a lot COPD.  She denies any erythema or incisions.  She denies a pain in the area or redness.  On exam there is some fullness over her right groin compared to her left.  Her robotic incisions are healing well without any erythema.  Patient has post right inguinal hernia repair.  No hard evidence for recurrence or postoperative problems.  I discussed with her that I would like to see her again in 2 months to see if that fullness has improved.

## 2023-10-01 NOTE — Progress Notes (Signed)
  THERAPIST PROGRESS NOTE  Virtual Visit via Video Note  I connected with Molly Greer on 10/01/23 at 12:00 PM EDT by a video enabled telemedicine application and verified that I am speaking with the correct person using two identifiers.  Location: Patient: Home Provider: Remote office   I discussed the limitations of evaluation and management by telemedicine and the availability of in person appointments. The patient expressed understanding and agreed to proceed.   I discussed the assessment and treatment plan with the patient. The patient was provided an opportunity to ask questions and all were answered. The patient agreed with the plan and demonstrated an understanding of the instructions.   The patient was advised to call back or seek an in-person evaluation if the symptoms worsen or if the condition fails to improve as anticipated.  I provided 60 minutes of non-face-to-face time during this encounter. [name], [facility]  Session Time: 12:00 PM - 1:00 PM  Participation Level: Active  Behavioral Response: Well Groomed, Alert, Dysphoric  Type of Therapy: Group Therapy  Interventions: CBT and Supportive  Summary: Dazani Norby is a 63 y.o. female who presents with a history of anxiety, depression and PTSD. She appeared somber but oriented x5. She noted progress on goal set last session but also noted a lot of rumination about the past. She was tearful as she discussed this. [name] identified her goal for this week as practicing validation and kindness towards herself.   Therapist Response: Conducted telehealth group session. Began session with introductions and review of group rules and expectations. Engaged group members in mindfulness exercise - guided meditation for healthy relationships. Conducted brief check-in with each group member. Reviewed handouts and worksheets - Interpersonal effectiveness module - FAST skill. Passenger transport manager with each group member and goal  setting between now and next session.   Suicidal/Homicidal: No  Diagnosis: Major depressive disorder, recurrent episode, moderate (HCC)  PTSD (post-traumatic stress disorder)  Generalized anxiety disorder  Patient/Guardian was advised Release of Information must be obtained prior to any record release in order to collaborate their care with an outside provider. Patient/Guardian was advised if they have not already done so to contact the registration department to sign all necessary forms in order for us  to release information regarding their care.   Consent: Patient/Guardian gives verbal consent for treatment and assignment of benefits for services provided during this visit. Patient/Guardian expressed understanding and agreed to proceed.   [name], [facility] 10/01/2023

## 2023-10-04 ENCOUNTER — Ambulatory Visit: Payer: Self-pay

## 2023-10-11 ENCOUNTER — Ambulatory Visit (INDEPENDENT_AMBULATORY_CARE_PROVIDER_SITE_OTHER): Admitting: Psychiatry

## 2023-10-11 ENCOUNTER — Other Ambulatory Visit: Payer: Self-pay

## 2023-10-11 ENCOUNTER — Encounter: Payer: Self-pay | Admitting: Psychiatry

## 2023-10-11 VITALS — BP 126/83 | HR 105 | Temp 95.4°F | Ht 62.0 in | Wt 186.8 lb

## 2023-10-11 DIAGNOSIS — F411 Generalized anxiety disorder: Secondary | ICD-10-CM

## 2023-10-11 DIAGNOSIS — F331 Major depressive disorder, recurrent, moderate: Secondary | ICD-10-CM

## 2023-10-11 DIAGNOSIS — F4310 Post-traumatic stress disorder, unspecified: Secondary | ICD-10-CM

## 2023-10-11 MED ORDER — HYDROXYZINE HCL 25 MG PO TABS: 25.0000 mg | ORAL_TABLET | Freq: Three times a day (TID) | ORAL | 0 refills | Status: AC | PRN

## 2023-10-11 NOTE — Progress Notes (Signed)
 BH MD/PA/NP OP Progress Note  10/11/2023 11:02 AM Molly Greer  MRN:  [id number]  Chief Complaint:  Chief Complaint  Patient presents with   Follow-up   HPI: 59 y old female in [facility] for followup.  Pt reports that she has been actively particpating in group therapy virtually with the [facility].  Pt reports she feels she is making progress but still overwhelmed with her medical responsibilities., stating she is having her teeth and hernia needing work. Pt reports that she feels overwhelmed and presents tearful and stating anxiety today on presentation.  Pt states that she continue to get around town with medicaid, but she has been staying organized.  Pt was also sharing her progress in DBT group.  Pt with symptoms of anxiety, worry, hopeless, and feelings of guilt.  She is still bothered by her belongings in the home in which she states it is almost like warning that was left by her daughter.  Patient reports that she is unable to know how to throw away all the stuff stating that this stresses her out every time she goes home.  Patient was encouraged that he takes little steps in order to make a big impact the patient was encouraged to move 5 items each day and to organize from pile of keep, unsure, and to throw away.  Patient has agreed to this.  Patient with no changes to medications patient will continue to take Lamictal  100 mg once daily and hydroxyzine  25 mg 3 times a day as needed for anxiety.  Patient with no other questions or concerns at this time.  Patient denies SI, HI, AVH.  Patient is in agreement with treatment plan.  Patient will follow up in 1 month while continue with therapy Visit Diagnosis:    ICD-10-CM   1. Major depressive disorder, recurrent episode, moderate (HCC)  F33.1     2. Generalized anxiety disorder  F41.1 hydrOXYzine  (ATARAX ) 25 MG tablet    3. PTSD (post-traumatic stress disorder)  F43.10       Past Psychiatric History:  Previous Psych Hospitalizations:   -Denies   Outpatient treatment:  -Denies   Medications Current: - Lamictal  100mg  once daily - Hydroxyzine  25mg  TID, for anxiety.    Medication Trials: -Denies   Suicide & Violence: -Denies SI, HI, AVH.  No previous suicide attempt.   Psychotherapy: -Currently in therapy with Almarie Ligas at [facility]   Legal: -Denies  Past Medical History:  Past Medical History:  Diagnosis Date   Asthma    Bone spur    right foot   Cocaine use    Depression    Generalized anxiety disorder    Hypertension    MRSA infection    Pneumonia    Right inguinal hernia 08/2023   Stroke [facility])     Past Surgical History:  Procedure Laterality Date   HERNIORRHAPHY, INGUINAL, ROBOT-ASSISTED, LAPAROSCOPIC Right 08/23/2023   Procedure: HERNIORRHAPHY, INGUINAL, ROBOT-ASSISTED, LAPAROSCOPIC;  Surgeon: Marinda Jayson KIDD, MD;  Location: [facility] ORS;  Service: General;  Laterality: Right;  with mesh   ROTATOR CUFF REPAIR Left    TUBAL LIGATION      Family Psychiatric History: No additional  Family History: History reviewed. No pertinent family history.  Social History:  Social History   Socioeconomic History   Marital status: Single    Spouse name: Not on file   Number of children: 3   Years of education: Not on file   Highest education level: Not on file  Occupational History  Not on file  Tobacco Use   Smoking status: Every Day    Current packs/day: 0.50    Types: Cigarettes    Passive exposure: Past   Smokeless tobacco: Never   Tobacco comments:    In the past 3 months she has reduced down 0.5 and had previously quit for a year during Covid years.        Exie Essex, CMA 08/01/23  Vaping Use   Vaping status: Former   Substances: Flavoring  Substance and Sexual Activity   Alcohol use: Yes    Comment: occassional   Drug use: Yes    Types: Cocaine    Comment: states not now, states no meth   Sexual activity: Not Currently  Other Topics Concern   Not on file  Social History  Narrative   Lives with partner   Social Drivers of Health   Financial Resource Strain: Low Risk  (08/02/2023)   Overall Financial Resource Strain (CARDIA)    Difficulty of Paying Living Expenses: Not very hard  Food Insecurity: No Food Insecurity (08/02/2023)   Hunger Vital Sign    Worried About Running Out of Food in the Last Year: Never true    Ran Out of Food in the Last Year: Never true  Transportation Needs: Unmet Transportation Needs (08/02/2023)   PRAPARE - Transportation    Lack of Transportation (Medical): No    Lack of Transportation (Non-Medical): Yes  Physical Activity: Inactive (08/02/2023)   Exercise Vital Sign    Days of Exercise per Week: 0 days    Minutes of Exercise per Session: 0 min  Stress: Stress Concern Present (08/02/2023)   [employer] of Occupational Health - Occupational Stress Questionnaire    Feeling of Stress : Very much  Social Connections: Socially Isolated (08/02/2023)   Social Connection and Isolation Panel    Frequency of Communication with Friends and Family: Never    Frequency of Social Gatherings with Friends and Family: Never    Attends Religious Services: Never    Database administrator or Organizations: No    Attends Banker Meetings: Never    Marital Status: Living with partner    Allergies:  Allergies  Allergen Reactions   Penicillins Swelling    Throat swelling    Metabolic Disorder Labs: No results found for: HGBA1C, MPG No results found for: PROLACTIN Lab Results  Component Value Date   CHOL 169 04/09/2011   TRIG 118 04/09/2011   HDL 41 04/09/2011   VLDL 24 04/09/2011   LDLCALC 104 (H) 04/09/2011   Lab Results  Component Value Date   TSH 1.110 06/16/2023   TSH 1.16 04/08/2011    Therapeutic Level Labs: No results found for: LITHIUM No results found for: VALPROATE No results found for: CBMZ  Current Medications: Current Outpatient Medications  Medication Sig Dispense Refill    albuterol  (VENTOLIN  HFA) 108 (90 Base) MCG/ACT inhaler Inhale 2 puffs into the lungs every 6 (six) hours as needed for wheezing or shortness of breath. 18 g 1   baclofen  (LIORESAL ) 10 MG tablet Take 10 mg by mouth daily.     Black Pepper-Turmeric (TURMERIC COMPLEX/BLACK PEPPER PO) Take 1,500 mg by mouth daily.     fluticasone -salmeterol (ADVAIR HFA) 230-21 MCG/ACT inhaler Inhale 2 puffs into the lungs 2 (two) times daily. 12 g 12   Green Tea, Camellia sinensis, (GREEN TEA PO) Take 2 capsules by mouth daily.     hydrOXYzine  (ATARAX ) 25 MG tablet Take 1 tablet (25  mg total) by mouth 3 (three) times daily as needed. 90 tablet 0   lamoTRIgine  (LAMICTAL ) 100 MG tablet Take 1 tablet (100 mg total) by mouth daily. 30 tablet 2   metoprolol  succinate (TOPROL  XL) 25 MG 24 hr tablet Take 1 tablet (25 mg total) by mouth at bedtime. 30 tablet 11   No current facility-administered medications for this visit.     Musculoskeletal: Strength & Muscle Tone: within normal limits Gait & Station: normal Patient leans: N/A  Psychiatric Specialty Exam: Review of Systems  Constitutional: Negative.   HENT: Negative.    Eyes: Negative.   Respiratory: Negative.    Cardiovascular: Negative.   Gastrointestinal: Negative.   Endocrine: Negative.   Genitourinary: Negative.   Musculoskeletal: Negative.   Skin: Negative.   Allergic/Immunologic: Negative.   Neurological: Negative.   Hematological: Negative.   Psychiatric/Behavioral:  Positive for decreased concentration. The patient is nervous/anxious.     Blood pressure 126/83, pulse (!) 105, temperature (!) 95.4 F (35.2 C), temperature source Temporal, height 5' 2 (1.575 m), weight 186 lb 12.8 oz (84.7 kg).Body mass index is 34.17 kg/m.  General Appearance: Casual and Well Groomed  Eye Contact:  Good  Speech:  Clear and Coherent  Volume:  Normal  Mood:  Hopeless and Worthless  Affect:  Appropriate and Tearful  Thought Process:  Coherent  Orientation:   Full (Time, Place, and Person)  Thought Content: Logical   Suicidal Thoughts:  No  Homicidal Thoughts:  No  Memory:  Immediate;   Good Recent;   Good Remote;   Good  Judgement:  Good  Insight:  Good  Psychomotor Activity:  Normal  Concentration:  Concentration: Good and Attention Span: Good  Recall:  Good  Fund of Knowledge: Good  Language: Good  Akathisia:  No  Handed:  Right  AIMS (if indicated):   Assets:  Desire for Improvement Financial Resources/Insurance Housing  ADL's:  Intact  Cognition: WNL  Sleep:  Good   Screenings: AUDIT    Advertising copywriter from 08/02/2023 in [facility]  Alcohol Use Disorder Identification Test Final Score (AUDIT) 3   GAD-7    Flowsheet Row Office Visit from 09/13/2023 in [facility] Counselor from 08/02/2023 in [facility]  Total GAD-7 Score 6 4   PHQ2-9    Flowsheet Row Office Visit from 09/13/2023 in [facility] Office Visit from 08/15/2023 in [facility] Counselor from 08/02/2023 in [facility] Office Visit from 07/19/2023 in [facility] Office Visit from 04/20/2023 in [facility]  PHQ-2 Total Score 2 2 4 4 2   PHQ-9 Total Score 10 11 12 16 7    Flowsheet Row Office Visit from 09/13/2023 in [facility] Admission (Discharged) from 08/23/2023 in [facility] Counselor from 08/02/2023 in [facility]  C-SSRS RISK CATEGORY No Risk No Risk No Risk     Assessment and Plan:  Assessment - Diagnosis:  Severe episode of recurrent major depressive disorder, without psychotic features (HCC) [F33.2]  Generalized anxiety disorder [F41.1]   PTSD (post-traumatic stress disorder) [F43.10]   Differential Diagnosis: Borderline Personality Disorder  - Progress: Patient arrived to appointment stating that she has improved with her symptoms with the PHQ-9 score of 15 and GAD 7 score of 10.  Ppt arrives tearful stating group  is going well - Risk Factors: Suicidal risk, worsening symptoms  Plan - Medications:  Continue Lamictal  100 mg once a day for depression.  Patient has been educated on the adverse reaction of rashes patient educated if a new rash develops to notify the clinic and stop the medication.  Patient has also been educated on the GI irritability and initial headache and dizziness and has been asked to wait and see if the symptoms are resolved.  Her last longer than a week please contact clinic Continue hydroxyzine  25 mg 3 times a day as needed for anxiety.  Patient educated that this medication is sedating and not to take out while driving or operating machinery. - Psychotherapy: Pt actively participating with Almarie Ligas for therapy.  - Education: Patient has been educated on her current stressors as well as coping strategies to help improve mood as well as her energy.  Patient instructed to walk outside for 20 minutes if possible to get some sunshine as well as to exercise to promote energy.  Patient also educated on medications with side effects and adverse reactions. - Follow-Up: Patient will follow up in 1 month - Referrals: No referrals - Safety Planning:  The patient has been educated, if they should have suicidal thoughts with or without a plan to call 911, or go to the closest emergency department.  Pt verbalized understanding.  Pt denies firearms within the home.  Pt also agrees to call the clinic should they have worsening symptoms before the next appointment.   Patient/Guardian was advised Release of Information must be obtained prior to any record release in order to collaborate their care with an outside  provider. Patient/Guardian was advised if they have not already done so to contact the registration department to sign all necessary forms in order for us  to release information regarding their care.   Consent: Patient/Guardian gives verbal consent for treatment and assignment of benefits for services provided during this visit. Patient/Guardian expressed understanding and agreed to proceed.    Dorn Jama Der, NP 10/11/2023, 11:02 AM

## 2023-10-12 ENCOUNTER — Ambulatory Visit: Admitting: Cardiology

## 2023-10-12 ENCOUNTER — Encounter: Payer: Self-pay | Admitting: Cardiology

## 2023-10-12 VITALS — BP 130/81 | HR 117 | Ht 62.0 in | Wt 189.4 lb

## 2023-10-12 DIAGNOSIS — Z131 Encounter for screening for diabetes mellitus: Secondary | ICD-10-CM

## 2023-10-12 DIAGNOSIS — M5440 Lumbago with sciatica, unspecified side: Secondary | ICD-10-CM | POA: Diagnosis not present

## 2023-10-12 DIAGNOSIS — F32A Depression, unspecified: Secondary | ICD-10-CM

## 2023-10-12 DIAGNOSIS — Z0130 Encounter for examination of blood pressure without abnormal findings: Secondary | ICD-10-CM

## 2023-10-12 DIAGNOSIS — R000 Tachycardia, unspecified: Secondary | ICD-10-CM | POA: Diagnosis not present

## 2023-10-12 DIAGNOSIS — Z13220 Encounter for screening for lipoid disorders: Secondary | ICD-10-CM

## 2023-10-12 DIAGNOSIS — G8929 Other chronic pain: Secondary | ICD-10-CM | POA: Diagnosis not present

## 2023-10-12 DIAGNOSIS — Z1329 Encounter for screening for other suspected endocrine disorder: Secondary | ICD-10-CM
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
## 2023-10-13 ENCOUNTER — Ambulatory Visit: Admitting: Professional Counselor

## 2023-10-13 ENCOUNTER — Other Ambulatory Visit: Payer: Self-pay | Admitting: Cardiology

## 2023-10-13 DIAGNOSIS — F4310 Post-traumatic stress disorder, unspecified: Secondary | ICD-10-CM | POA: Diagnosis not present

## 2023-10-13 DIAGNOSIS — F331 Major depressive disorder, recurrent, moderate: Secondary | ICD-10-CM

## 2023-10-13 DIAGNOSIS — F411 Generalized anxiety disorder: Secondary | ICD-10-CM | POA: Diagnosis not present

## 2023-10-13 LAB — CMP14+EGFR
ALT: 116 IU/L — ABNORMAL HIGH (ref 0–32)
AST: 107 IU/L — ABNORMAL HIGH (ref 0–40)
Albumin: 4.5 g/dL (ref 3.9–4.9)
Alkaline Phosphatase: 125 IU/L — ABNORMAL HIGH (ref 44–121)
BUN/Creatinine Ratio: 18 (ref 12–28)
BUN: 13 mg/dL (ref 8–27)
Bilirubin Total: 0.4 mg/dL (ref 0.0–1.2)
CO2: 17 mmol/L — ABNORMAL LOW (ref 20–29)
Calcium: 9.4 mg/dL (ref 8.7–10.3)
Chloride: 102 mmol/L (ref 96–106)
Creatinine, Ser: 0.72 mg/dL (ref 0.57–1.00)
Globulin, Total: 2.6 g/dL (ref 1.5–4.5)
Glucose: 113 mg/dL — ABNORMAL HIGH (ref 70–99)
Potassium: 4.2 mmol/L (ref 3.5–5.2)
Sodium: 139 mmol/L (ref 134–144)
Total Protein: 7.1 g/dL (ref 6.0–8.5)
eGFR: 94 mL/min/1.73 (ref >59)

## 2023-10-13 LAB — LIPID PANEL
Chol/HDL Ratio: 5.8 ratio — ABNORMAL HIGH (ref 0.0–4.4)
Cholesterol, Total: 315 mg/dL — ABNORMAL HIGH (ref 100–199)
HDL: 54 mg/dL (ref >39)
LDL Chol Calc (NIH): 231 mg/dL — ABNORMAL HIGH (ref 0–99)
Triglycerides: 159 mg/dL — ABNORMAL HIGH (ref 0–149)
VLDL Cholesterol Cal: 30 mg/dL (ref 5–40)

## 2023-10-13 LAB — HEMOGLOBIN A1C
Est. average glucose Bld gHb Est-mCnc: 114 mg/dL
Hgb A1c MFr Bld: 5.6 % (ref 4.8–5.6)

## 2023-10-13 LAB — TSH: TSH: 1.31 u[IU]/mL (ref 0.450–4.500)

## 2023-10-13 NOTE — Progress Notes (Unsigned)
  THERAPIST PROGRESS NOTE  Virtual Visit via Video Note  I connected with Molly Greer on 10/15/23 at 12:00 PM EDT by a video enabled telemedicine application and verified that I am speaking with the correct person using two identifiers.  Location: Patient: Home Provider: Remote office   I discussed the limitations of evaluation and management by telemedicine and the availability of in person appointments. The patient expressed understanding and agreed to proceed.   I discussed the assessment and treatment plan with the patient. The patient was provided an opportunity to ask questions and all were answered. The patient agreed with the plan and demonstrated an understanding of the instructions.   The patient was advised to call back or seek an in-person evaluation if the symptoms worsen or if the condition fails to improve as anticipated.  I provided 58 minutes of non-face-to-face time during this encounter. Molly Greer, [facility]  Session Time: 12:00 PM - 12:58 PM   Participation Level: Active  Behavioral Response: Well GroomedAlertDysphoric  Type of Therapy: Group Therapy  Interventions: CBT and Supportive  Summary: Molly Greer is a 63 y.o. female who presents with a history of anxiety, depression, and PTSD. She appeared alert and oriented x5. She stated some progress on last session's goal. She expressed some concern over COPD diagnosis. [name] was supportive to there group members during session. She identified this week goal as asking her ongoing [name] for her number.   Therapist Response: Conducted telehealth group session. Began session with introductions and review of group rules and expectations. Engaged group members in mindfulness exercise - guided meditation for self-love. Conducted brief check-in with each group member. Reviewed handouts and worksheets - Interpersonal effectiveness - Finding and building friendships. Passenger transport manager with each group member and  goal setting between now and next session.   Suicidal/Homicidal: No  Diagnosis: Major depressive disorder, recurrent episode, moderate (HCC)  Generalized anxiety disorder  PTSD (post-traumatic stress disorder)  Patient/Guardian was advised Release of Information must be obtained prior to any record release in order to collaborate their care with an outside provider. Patient/Guardian was advised if they have not already done so to contact the registration department to sign all necessary forms in order for us  to release information regarding their care.   Consent: Patient/Guardian gives verbal consent for treatment and assignment of benefits for services provided during this visit. Patient/Guardian expressed understanding and agreed to proceed.   Molly Greer, [facility] 10/15/2023

## 2023-10-20 ENCOUNTER — Ambulatory Visit (INDEPENDENT_AMBULATORY_CARE_PROVIDER_SITE_OTHER): Admitting: Professional Counselor

## 2023-10-20 DIAGNOSIS — F4310 Post-traumatic stress disorder, unspecified: Secondary | ICD-10-CM | POA: Diagnosis not present

## 2023-10-20 DIAGNOSIS — F331 Major depressive disorder, recurrent, moderate: Secondary | ICD-10-CM

## 2023-10-20 DIAGNOSIS — F411 Generalized anxiety disorder: Secondary | ICD-10-CM | POA: Diagnosis not present

## 2023-10-23 NOTE — Progress Notes (Signed)
  THERAPIST PROGRESS NOTE  Virtual Visit via Video Note  I connected with Molly Greer on 10/23/23 at 12:00 PM EDT by a video enabled telemedicine application and verified that I am speaking with the correct person using two identifiers.  Location: Patient: Home Provider: Remote office   I discussed the limitations of evaluation and management by telemedicine and the availability of in person appointments. The patient expressed understanding and agreed to proceed.  I discussed the assessment and treatment plan with the patient. The patient was provided an opportunity to ask questions and all were answered. The patient agreed with the plan and demonstrated an understanding of the instructions.   The patient was advised to call back or seek an in-person evaluation if the symptoms worsen or if the condition fails to improve as anticipated.  I provided 66 minutes of non-face-to-face time during this encounter. [name], [facility]  Session Time: 12:00 PM - 1:06 PM  Participation Level: Active  Behavioral Response: Well Groomed, Alert, Dysphoric  Type of Therapy: Group Therapy  Interventions: CBT and Supportive  Summary: Shyanna Klingel is a 63 y.o. female who presents with anxiety, depression, and PTSD. She appeared alert and oriented x5. She reported she has been working on her to-do list and talking to people. She didn't ask for the Portal driver's number though. [name] reported her goal for this week is to use WISE MIND and tell others directly how she feels.   Therapist Response: Conducted telehealth group session. Began session with introductions and review of group rules and expectations. Engaged group members in mindfulness exercise - guided meditation for receiving love in relationships. Conducted brief check-in with each group member. Reviewed handouts and worksheets - Interpersonal effectiveness module - Mindfulness of others and ending destructive relationships. Systems developer with each group member and goal setting between now and next session.   Suicidal/Homicidal: No  Diagnosis: Major depressive disorder, recurrent episode, moderate (HCC)  Generalized anxiety disorder  PTSD (post-traumatic stress disorder)  Patient/Guardian was advised Release of Information must be obtained prior to any record release in order to collaborate their care with an outside provider. Patient/Guardian was advised if they have not already done so to contact the registration department to sign all necessary forms in order for us  to release information regarding their care.   Consent: Patient/Guardian gives verbal consent for treatment and assignment of benefits for services provided during this visit. Patient/Guardian expressed understanding and agreed to proceed.   [name], [facility] 10/23/2023

## 2023-10-27 ENCOUNTER — Ambulatory Visit: Admitting: Professional Counselor

## 2023-11-07 ENCOUNTER — Encounter: Payer: Self-pay | Admitting: General Surgery

## 2023-11-07 ENCOUNTER — Ambulatory Visit (INDEPENDENT_AMBULATORY_CARE_PROVIDER_SITE_OTHER): Admitting: General Surgery

## 2023-11-07 VITALS — BP 199/96 | HR 96 | Temp 97.8°F | Ht 62.0 in | Wt 192.8 lb

## 2023-11-07 DIAGNOSIS — K4090 Unilateral inguinal hernia, without obstruction or gangrene, not specified as recurrent: Secondary | ICD-10-CM

## 2023-11-07 DIAGNOSIS — Z09 Encounter for follow-up examination after completed treatment for conditions other than malignant neoplasm: Secondary | ICD-10-CM

## 2023-11-07 NOTE — Patient Instructions (Signed)
 Groin Hernia (Inguinal Hernia) in Adults: What to Know  A hernia happens when an organ or tissue in your body pushes out through a weak spot in the muscles of your belly. This makes a bulge. A groin hernia is also called an inguinal hernia. It's found in your groin, which is the area where your leg meets your lower belly. This kind of hernia could also be: In your scrotum, if you're female. In the folds of skin around your vagina, if you're female. You may be able to push the bulge back into your belly. If you can't push it in and blood flow is cut off to the hernia, you'll need surgery right away. What are the causes? A groin hernia may happen when you strain your belly muscles, such as when you: Lift a heavy object. Strain to poop. Cough. What increases the risk? You may be more likely to get a groin hernia if: You're female. You're 50 years or older. You're pregnant. You've had a groin hernia or belly surgery before. You smoke. You're overweight. You work at a job where you need to stand a lot or lift heavy things. What are the signs or symptoms? Symptoms may depend on how big the hernia is. If it's small, you may not have symptoms. If it's bigger, you may have: A bulge near your groin or genitals. Pain or burning in your groin. A dull ache or feeling of pressure in your groin. If blood flow is cut off to the tissues inside the hernia, you may also: Feel pain and tenderness when you touch the bulge. The skin over it may turn red or purple. Have a fever. Throw up or feel like you may throw up. Have trouble pooping or passing gas. How is this treated? Treatment depends on how big the hernia is and what symptoms you have. You may need: To be watched to see if the bulge grows bigger. Surgery. This may be done if the hernia is big or if you have symptoms. Follow these instructions at home: Lifestyle Ask if it's OK for you to lift. Try not to stand for long periods of time. Do not  smoke, vape, or use nicotine or tobacco. Stay at a healthy weight. Try not to do things that put pressure on your hernia. Preventing trouble pooping You may need to take these steps to help prevent or treat trouble pooping (constipation): Take medicines to help you poop. Eat foods high in fiber, like beans, whole grains, and fresh fruits and vegetables. Drink more fluids as told. General instructions Try to push the hernia back in place by very gently pressing on it while lying down. Do not try to force it back in if it won't push in easily. Watch your hernia for any changes in: Shape. Size. Color. Take your medicines only as told. Contact a doctor if: You have a fever. You have new symptoms. Your symptoms get worse. You can't poop or pass gas. Get help right away if: Your bulge: Starts to hurt a lot. Changes color. You have sudden pain in your scrotum, or your scrotum changes size. You can't gently push the hernia back in place. You feel like you may vomit, and that feeling does not go away. You keep throwing up or feeling like you need to throw up. These symptoms may be an emergency. Call 911 right away. Do not wait to see if the symptoms will go away. Do not drive yourself to the hospital. This information is  not intended to replace advice given to you by your health care provider. Make sure you discuss any questions you have with your health care provider. Document Revised: 11/17/2022 Document Reviewed: 11/17/2022 Elsevier Patient Education  2024 [employer].

## 2023-11-08 ENCOUNTER — Ambulatory Visit (INDEPENDENT_AMBULATORY_CARE_PROVIDER_SITE_OTHER): Admitting: Psychiatry

## 2023-11-08 ENCOUNTER — Other Ambulatory Visit: Payer: Self-pay

## 2023-11-08 ENCOUNTER — Encounter: Payer: Self-pay | Admitting: Psychiatry

## 2023-11-08 VITALS — BP 142/86 | HR 99 | Temp 97.0°F | Ht 62.0 in | Wt 194.0 lb

## 2023-11-08 DIAGNOSIS — F331 Major depressive disorder, recurrent, moderate: Secondary | ICD-10-CM

## 2023-11-08 DIAGNOSIS — F411 Generalized anxiety disorder: Secondary | ICD-10-CM

## 2023-11-08 NOTE — Progress Notes (Signed)
 BH MD/PA/NP OP Progress Note  11/08/2023 10:52 AM Molly Greer  MRN:  [id number]  Chief Complaint:  Chief Complaint  Patient presents with   Follow-up   HPI: 63 year old female present [facility] for follow-up.  Patient reports that she has been feeling pain a lot in her back as well as her teeth.  Patient reports that she is waiting for appointments with orthopedics as well as the dentist to try to get the pain settled.  Patient reports that she has been not feeling well due to the pain but states that she has been taking medications as prescribed and reports that she has had high blood pressure multiple times this past week, but patient has been educated that this could be related due to her pain.  Patient also reports that she has been attending group and has been developing positive coping mechanisms and strategies through DBT skills in order to help cope with throughout her days.  The patient states she is making good progress and states that she is really happy with the DBT group as well as meeting this provider.  Based on this assessment interview is recommended for the patient to continue medications as prescribed.  Patient states no changes are necessary and states she is focusing on her physical health at this time try to manage her pain better patient denies SI, HI, AVH.  Patient will follow up in 1 month.  Patient is in agreement with treatment plan.  Patient with no other questions or concerns at this time. Visit Diagnosis:    ICD-10-CM   1. Major depressive disorder, recurrent episode, moderate (HCC)  F33.1     2. Generalized anxiety disorder  F41.1       Past Psychiatric History:  Previous Psych Hospitalizations:  -Denies   Outpatient treatment:  -Denies   Medications Current: - Lamictal  100mg  once daily - Hydroxyzine  25mg  TID, for anxiety.    Medication Trials: -Denies   Suicide & Violence: -Denies SI, HI, AVH.  No previous suicide attempt.   Psychotherapy: -Currently  in therapy with Almarie Ligas at [facility].   Legal: -Denies  Past Medical History:  Past Medical History:  Diagnosis Date   Asthma    Bone spur    right foot   Cocaine use    Depression    Generalized anxiety disorder    Hypertension    MRSA infection    Pneumonia    Right inguinal hernia 08/2023   Stroke [facility])     Past Surgical History:  Procedure Laterality Date   HERNIORRHAPHY, INGUINAL, ROBOT-ASSISTED, LAPAROSCOPIC Right 08/23/2023   Procedure: HERNIORRHAPHY, INGUINAL, ROBOT-ASSISTED, LAPAROSCOPIC;  Surgeon: Marinda Jayson KIDD, MD;  Location: [facility] ORS;  Service: General;  Laterality: Right;  with mesh   ROTATOR CUFF REPAIR Left    TUBAL LIGATION      Family Psychiatric History: No additional  Family History: History reviewed. No pertinent family history.  Social History:  Social History   Socioeconomic History   Marital status: Single    Spouse name: Not on file   Number of children: 3   Years of education: Not on file   Highest education level: Not on file  Occupational History   Not on file  Tobacco Use   Smoking status: Every Day    Current packs/day: 0.50    Types: Cigarettes    Passive exposure: Past   Smokeless tobacco: Never   Tobacco comments:    In the past 3 months she has reduced down 0.5 and  had previously quit for a year during Covid years.        Exie Essex, CMA 08/01/23  Vaping Use   Vaping status: Former   Substances: Flavoring  Substance and Sexual Activity   Alcohol use: Yes    Comment: occassional   Drug use: Yes    Types: Cocaine    Comment: states not now, states no meth   Sexual activity: Not Currently  Other Topics Concern   Not on file  Social History Narrative   Lives with partner   Social Drivers of Health   Financial Resource Strain: Low Risk  (08/02/2023)   Overall Financial Resource Strain (CARDIA)    Difficulty of Paying Living Expenses: Not very hard  Food Insecurity: No Food Insecurity (08/02/2023)   Hunger Vital  Sign    Worried About Running Out of Food in the Last Year: Never true    Ran Out of Food in the Last Year: Never true  Transportation Needs: Unmet Transportation Needs (08/02/2023)   PRAPARE - Transportation    Lack of Transportation (Medical): No    Lack of Transportation (Non-Medical): Yes  Physical Activity: Inactive (08/02/2023)   Exercise Vital Sign    Days of Exercise per Week: 0 days    Minutes of Exercise per Session: 0 min  Stress: Stress Concern Present (08/02/2023)   [employer] of Occupational Health - Occupational Stress Questionnaire    Feeling of Stress : Very much  Social Connections: Socially Isolated (08/02/2023)   Social Connection and Isolation Panel    Frequency of Communication with Friends and Family: Never    Frequency of Social Gatherings with Friends and Family: Never    Attends Religious Services: Never    Database administrator or Organizations: No    Attends Banker Meetings: Never    Marital Status: Living with partner    Allergies:  Allergies  Allergen Reactions   Penicillins Swelling    Throat swelling    Metabolic Disorder Labs: Lab Results  Component Value Date   HGBA1C 5.6 10/12/2023   No results found for: PROLACTIN Lab Results  Component Value Date   CHOL 315 (H) 10/12/2023   TRIG 159 (H) 10/12/2023   HDL 54 10/12/2023   CHOLHDL 5.8 (H) 10/12/2023   VLDL 24 04/09/2011   LDLCALC 231 (H) 10/12/2023   LDLCALC 104 (H) 04/09/2011   Lab Results  Component Value Date   TSH 1.310 10/12/2023   TSH 1.110 06/16/2023    Therapeutic Level Labs: No results found for: LITHIUM No results found for: VALPROATE No results found for: CBMZ  Current Medications: Current Outpatient Medications  Medication Sig Dispense Refill   baclofen  (LIORESAL ) 10 MG tablet TAKE 1 TABLET BY MOUTH ONCE DAILY 30 each 0   Black Pepper-Turmeric (TURMERIC COMPLEX/BLACK PEPPER PO) Take 1,500 mg by mouth daily.     fluticasone -salmeterol  (ADVAIR HFA) 230-21 MCG/ACT inhaler Inhale 2 puffs into the lungs 2 (two) times daily. 12 g 12   Green Tea, Camellia sinensis, (GREEN TEA 600 PO) Take 400 mg by mouth.     Green Tea, Camellia sinensis, (GREEN TEA PO) Take 2 capsules by mouth daily.     hydrOXYzine  (ATARAX ) 25 MG tablet Take 1 tablet (25 mg total) by mouth 3 (three) times daily as needed. 90 tablet 0   lamoTRIgine  (LAMICTAL ) 100 MG tablet Take 1 tablet (100 mg total) by mouth daily. 30 tablet 2   VENTOLIN  HFA 108 (90 Base) MCG/ACT inhaler INHALE 2  PUFFS INTO THE LUNGS EVERY 6 HOURS AS NEEDED FOR WHEEZING OR SHORTNESS OF BREATH 18 g 1   No current facility-administered medications for this visit.     Musculoskeletal: Strength & Muscle Tone: within normal limits Gait & Station: normal Patient leans: N/A  Psychiatric Specialty Exam: Review of Systems  Constitutional: Negative.   HENT: Negative.    Eyes: Negative.   Respiratory: Negative.    Cardiovascular: Negative.   Gastrointestinal: Negative.   Endocrine: Negative.   Genitourinary: Negative.   Musculoskeletal: Negative.   Skin: Negative.   Allergic/Immunologic: Negative.   Neurological: Negative.   Hematological: Negative.   Psychiatric/Behavioral:  Positive for dysphoric mood. The patient is nervous/anxious.     Blood pressure (!) 142/86, pulse 99, temperature (!) 97 F (36.1 C), temperature source Temporal, height 5' 2 (1.575 m), weight 194 lb (88 kg).Body mass index is 35.48 kg/m.  General Appearance: Well Groomed  Eye Contact:  Good  Speech:  Clear and Coherent  Volume:  Normal  Mood:  Anxious and Depressed  Affect:  Depressed  Thought Process:  Coherent  Orientation:  Full (Time, Place, and Person)  Thought Content: WDL   Suicidal Thoughts:  No  Homicidal Thoughts:  No  Memory:  Immediate;   Good Recent;   Good Remote;   Good  Judgement:  Good  Insight:  Good  Psychomotor Activity:  Normal  Concentration:  Concentration: Good and Attention  Span: Good  Recall:  Good  Fund of Knowledge: Good  Language: Good  Akathisia:  No  Handed:  Right  AIMS (if indicated):   Assets:  Desire for Improvement Financial Resources/Insurance Housing  ADL's:  Intact  Cognition: WNL  Sleep:  Good   Screenings: AUDIT    Advertising copywriter from 08/02/2023 in [facility]  Alcohol Use Disorder Identification Test Final Score (AUDIT) 3   GAD-7    Flowsheet Row Office Visit from 10/11/2023 in [facility] Office Visit from 09/13/2023 in [facility] Counselor from 08/02/2023 in [facility]  Total GAD-7 Score 10 6 4    PHQ2-9    Flowsheet Row Office Visit from 10/11/2023 in [facility] Office Visit from 09/13/2023 in [facility] Office Visit from 08/15/2023 in [facility] Counselor from 08/02/2023 in [facility] Office Visit from 07/19/2023 in [facility]  PHQ-2 Total Score 5 2 2 4 4   PHQ-9 Total Score 15 10 11 12 16    Flowsheet Row Office Visit from 09/13/2023 in [facility] Admission (Discharged) from 08/23/2023 in [facility] Counselor from 08/02/2023 in [facility]  C-SSRS RISK CATEGORY No Risk No Risk No Risk     Assessment and Plan:  Assessment - Diagnosis:  Severe episode of recurrent major depressive disorder, without psychotic features (HCC) [F33.2]  Generalized anxiety disorder [F41.1]  PTSD (post-traumatic stress disorder) [F43.10]    Differential Diagnosis: Borderline Personality Disorder  - Progress: Patient is experiencing pain in her back, pt reports  she also needs to get her teeth worked on.  - Risk Factors: Suicidal risk, worsening symptoms  Plan - Medications:  Continue Lamictal  100 mg once a day for depression.  Patient has been educated on the adverse reaction of rashes patient educated if a new rash develops  to notify the clinic and stop the medication.  Patient has also been educated on the GI irritability and initial headache and dizziness and has been asked to wait and see if the symptoms are resolved.  Her last longer than a week please contact clinic Continue hydroxyzine  25 mg 3 times a day as needed for anxiety.  Patient educated that this medication is sedating and not to take out while driving or operating machinery. - Psychotherapy: Pt actively participating with Almarie Ligas for therapy.  - Education: Patient has been educated on her current stressors as well as coping strategies to help improve mood as well as her energy.  Patient instructed to walk outside for 20 minutes if possible to get some sunshine as well as to exercise to promote energy.  Patient also educated on medications with side effects and adverse reactions. - Follow-Up: Patient will follow up in 1 month - Referrals: No referrals - Safety Planning:  The patient has been educated, if they should have suicidal thoughts with or without a plan to call 911, or go to the closest emergency department.  Pt verbalized understanding.  Pt denies firearms within the home.  Pt also agrees to call the clinic should they have worsening symptoms before the next appointment.   Patient/Guardian was advised Release of Information must be obtained prior to any record release in order to collaborate their care with an outside provider. Patient/Guardian was advised if they have not already done so to contact the registration department to sign all necessary forms in order for us  to release information regarding their care.   Consent: Patient/Guardian gives verbal consent for  treatment and assignment of benefits for services provided during this visit. Patient/Guardian expressed understanding and agreed to proceed.    Dorn Jama Der, NP 11/08/2023, 10:52 AM

## 2023-11-10 ENCOUNTER — Ambulatory Visit: Admitting: Professional Counselor

## 2023-11-10 NOTE — Progress Notes (Signed)
 Patient status post right inguinal hernia repair.  She is doing well.  She is not having any pain and tolerating a diet and having bowel function.  She says the prominence in her right groin seems to be decreasing significantly.  On exam she does have some swelling to the right groin but it again is improved from previous.  We will plan to see her again in 4 weeks to see if the swelling has resolved.

## 2023-11-15 ENCOUNTER — Other Ambulatory Visit: Payer: Self-pay | Admitting: Cardiology

## 2023-11-17 ENCOUNTER — Ambulatory Visit (INDEPENDENT_AMBULATORY_CARE_PROVIDER_SITE_OTHER): Admitting: Professional Counselor

## 2023-11-17 DIAGNOSIS — F4310 Post-traumatic stress disorder, unspecified: Secondary | ICD-10-CM

## 2023-11-17 DIAGNOSIS — F411 Generalized anxiety disorder: Secondary | ICD-10-CM

## 2023-11-17 DIAGNOSIS — F331 Major depressive disorder, recurrent, moderate: Secondary | ICD-10-CM

## 2023-11-20 NOTE — Progress Notes (Signed)
  THERAPIST PROGRESS NOTE  Virtual Visit via Video Note  I connected with Molly Greer on 11/20/23 at 12:00 PM EDT by a video enabled telemedicine application and verified that I am speaking with the correct person using two identifiers.  Location: Patient: Home Provider: Remote office   I discussed the limitations of evaluation and management by telemedicine and the availability of in person appointments. The patient expressed understanding and agreed to proceed.   I discussed the assessment and treatment plan with the patient. The patient was provided an opportunity to ask questions and all were answered. The patient agreed with the plan and demonstrated an understanding of the instructions.   The patient was advised to call back or seek an in-person evaluation if the symptoms worsen or if the condition fails to improve as anticipated.  I provided 60 minutes of non-face-to-face time during this encounter. [name], [facility]  Session Time: 12:00 PM - 1:00 PM   Participation Level: Active  Behavioral Response: Casual, Alert, Dysphoric  Type of Therapy: Group Therapy  Interventions: CBT and Supportive  Summary: Molly Greer is a 63 y.o. female who presents with a history of anxiety, depression, and trauma. She appeared alert and oriented x5. She stated she's been having some physical things going on but otherwise is okay. She stated her goal is to practice validation more.   Therapist Response: Conducted telehealth group session. Began session with introductions and review of group rules and expectations. Engaged group members in mindfulness exercise - guided meditation for self-validation. Conducted brief check-in with each group member. Reviewed handouts and worksheets - Interpersonal effectiveness module - Behavior change strategies. Passenger transport manager with each group member and goal setting between now and next session.   Suicidal/Homicidal: No  Diagnosis: PTSD  (post-traumatic stress disorder)  Major depressive disorder, recurrent episode, moderate (HCC)  Generalized anxiety disorder  Patient/Guardian was advised Release of Information must be obtained prior to any record release in order to collaborate their care with an outside provider. Patient/Guardian was advised if they have not already done so to contact the registration department to sign all necessary forms in order for us  to release information regarding their care.   Consent: Patient/Guardian gives verbal consent for treatment and assignment of benefits for services provided during this visit. Patient/Guardian expressed understanding and agreed to proceed.   [name], [facility] 11/20/2023

## 2023-11-22 ENCOUNTER — Ambulatory Visit: Admitting: Internal Medicine

## 2023-11-24 ENCOUNTER — Other Ambulatory Visit: Payer: Self-pay

## 2023-11-24 DIAGNOSIS — J449 Chronic obstructive pulmonary disease, unspecified: Secondary | ICD-10-CM

## 2023-11-24 MED ORDER — ADVAIR HFA 230-21 MCG/ACT IN AERO: 2.0000 | INHALATION_SPRAY | Freq: Two times a day (BID) | RESPIRATORY_TRACT | 3 refills | Status: AC

## 2023-11-30 ENCOUNTER — Ambulatory Visit: Admitting: Internal Medicine

## 2023-12-01 ENCOUNTER — Ambulatory Visit: Admitting: Professional Counselor

## 2023-12-06 ENCOUNTER — Ambulatory Visit: Admitting: Psychiatry

## 2023-12-07 ENCOUNTER — Ambulatory Visit: Admitting: General Surgery

## 2023-12-08 ENCOUNTER — Ambulatory Visit: Admitting: Professional Counselor

## 2023-12-15 ENCOUNTER — Ambulatory Visit: Admitting: Professional Counselor

## 2023-12-15 DIAGNOSIS — F4310 Post-traumatic stress disorder, unspecified: Secondary | ICD-10-CM | POA: Diagnosis not present

## 2023-12-15 DIAGNOSIS — F411 Generalized anxiety disorder: Secondary | ICD-10-CM | POA: Diagnosis not present

## 2023-12-15 DIAGNOSIS — F331 Major depressive disorder, recurrent, moderate: Secondary | ICD-10-CM | POA: Diagnosis not present

## 2023-12-18 NOTE — Progress Notes (Signed)
  THERAPIST PROGRESS NOTE  Virtual Visit via Video Note  I connected with Molly Greer on 12/18/23 at 12:00 PM EDT by a video enabled telemedicine application and verified that I am speaking with the correct person using two identifiers.  Location: Patient: Home Provider: Remote office   I discussed the limitations of evaluation and management by telemedicine and the availability of in person appointments. The patient expressed understanding and agreed to proceed.  I discussed the assessment and treatment plan with the patient. The patient was provided an opportunity to ask questions and all were answered. The patient agreed with the plan and demonstrated an understanding of the instructions.   The patient was advised to call back or seek an in-person evaluation if the symptoms worsen or if the condition fails to improve as anticipated.  I provided 60 minutes of non-face-to-face time during this encounter. Molly Greer, [facility]  Session Time: 12:00 PM - 1:00 PM   Participation Level: Active  Behavioral Response: Well Groomed, Alert, Dysphoric  Type of Therapy: Group Therapy  Interventions: CBT and Supportive  Summary: Molly Greer is a 63 y.o. female who presents with a history of anxiety, depression, and trauma. She appeared somber but oriented x5. She shared reasons for being absent from group the past couple of weeks and noted how she worked on her goal of validating others. Molly Greer reported she is trying to listen more, rather than responding to everything about her own experiences. She noted her goal for this week is to continue accepting reality and trying to pick up old/new hobbies.   Therapist Response: Conducted telehealth group session. Began session with introductions and review of group rules and expectations. Engaged group members in mindfulness exercise - guided meditation for letting go of anger. Conducted brief check-in with each group member. Reviewed handouts and  worksheets - Emotion regulation module - Envy and Jealousy. Passenger transport manager with each group member and goal setting between now and next session.   Suicidal/Homicidal: No  Diagnosis: PTSD (post-traumatic stress disorder)  Major depressive disorder, recurrent episode, moderate (HCC)  Generalized anxiety disorder  Patient/Guardian was advised Release of Information must be obtained prior to any record release in order to collaborate their care with an outside provider. Patient/Guardian was advised if they have not already done so to contact the registration department to sign all necessary forms in order for us  to release information regarding their care.   Consent: Patient/Guardian gives verbal consent for treatment and assignment of benefits for services provided during this visit. Patient/Guardian expressed understanding and agreed to proceed.   Molly Greer, [facility] 12/18/2023

## 2023-12-22 ENCOUNTER — Ambulatory Visit (INDEPENDENT_AMBULATORY_CARE_PROVIDER_SITE_OTHER): Admitting: Professional Counselor

## 2023-12-22 DIAGNOSIS — F331 Major depressive disorder, recurrent, moderate: Secondary | ICD-10-CM

## 2023-12-22 DIAGNOSIS — F411 Generalized anxiety disorder: Secondary | ICD-10-CM | POA: Diagnosis not present

## 2023-12-22 DIAGNOSIS — F4310 Post-traumatic stress disorder, unspecified: Secondary | ICD-10-CM

## 2023-12-23 NOTE — Progress Notes (Signed)
  THERAPIST PROGRESS NOTE  Virtual Visit via Video Note  I connected with Molly Greer on 12/23/23 at 12:00 PM EDT by a video enabled telemedicine application and verified that I am speaking with the correct person using two identifiers.  Location: Patient: Home Provider: Remote office   I discussed the limitations of evaluation and management by telemedicine and the availability of in person appointments. The patient expressed understanding and agreed to proceed.   I discussed the assessment and treatment plan with the patient. The patient was provided an opportunity to ask questions and all were answered. The patient agreed with the plan and demonstrated an understanding of the instructions.   The patient was advised to call back or seek an in-person evaluation if the symptoms worsen or if the condition fails to improve as anticipated.  I provided 53 minutes of non-face-to-face time during this encounter. [name], [facility]  Session Time: 12:08 PM - 1:01 PM  Participation Level: Active  Behavioral Response: CasualAlertDysphoric  Type of Therapy: Group Therapy  Interventions: CBT and Supportive  Summary: Molly Greer is a 63 y.o. female who presents with a history of anxiety, depression, and trauma. She appeared somber but oriented x5. [name] stated she practiced some of the things discussed in previous sessions and was able to listen to her daughter, which she feels helped bring them closer. She reported her goal for this week is to reduce isolation, sit outside, and work on self confidence.  Therapist Response: Conducted telehealth group session. Began session with introductions and review of group rules and expectations. Engaged group members in mindfulness exercise - guided meditation for reducing jealousy and envy. Conducted brief check-in with each group member. Reviewed handouts and worksheets - Emotion regulation module - Fear and happiness. Passenger transport manager with  each group member and goal setting between now and next session.   Suicidal/Homicidal: No  Diagnosis: PTSD (post-traumatic stress disorder)  Major depressive disorder, recurrent episode, moderate (HCC)  Generalized anxiety disorder  Patient/Guardian was advised Release of Information must be obtained prior to any record release in order to collaborate their care with an outside provider. Patient/Guardian was advised if they have not already done so to contact the registration department to sign all necessary forms in order for us  to release information regarding their care.   Consent: Patient/Guardian gives verbal consent for treatment and assignment of benefits for services provided during this visit. Patient/Guardian expressed understanding and agreed to proceed.   [name], [facility] 12/23/2023

## 2023-12-27 ENCOUNTER — Encounter: Payer: Self-pay | Admitting: Psychiatry

## 2023-12-27 ENCOUNTER — Other Ambulatory Visit: Payer: Self-pay

## 2023-12-27 ENCOUNTER — Ambulatory Visit: Admitting: Psychiatry

## 2023-12-27 VITALS — BP 139/84 | HR 103 | Temp 97.4°F | Ht 62.0 in | Wt 187.0 lb

## 2023-12-27 DIAGNOSIS — F4310 Post-traumatic stress disorder, unspecified: Secondary | ICD-10-CM

## 2023-12-27 DIAGNOSIS — F331 Major depressive disorder, recurrent, moderate: Secondary | ICD-10-CM | POA: Diagnosis not present

## 2023-12-27 DIAGNOSIS — F411 Generalized anxiety disorder: Secondary | ICD-10-CM | POA: Diagnosis not present

## 2023-12-27 MED ORDER — BUPROPION HCL ER (XL) 150 MG PO TB24: 150.0000 mg | ORAL_TABLET | Freq: Every day | ORAL | 0 refills | Status: DC
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
## 2023-12-29 ENCOUNTER — Ambulatory Visit: Admitting: Professional Counselor

## 2023-12-29 DIAGNOSIS — F411 Generalized anxiety disorder: Secondary | ICD-10-CM

## 2023-12-29 DIAGNOSIS — F4310 Post-traumatic stress disorder, unspecified: Secondary | ICD-10-CM

## 2023-12-29 DIAGNOSIS — F331 Major depressive disorder, recurrent, moderate: Secondary | ICD-10-CM | POA: Diagnosis not present

## 2024-01-01 NOTE — Progress Notes (Signed)
  THERAPIST PROGRESS NOTE  Virtual Visit via Video Note  I connected with Molly Greer on 01/01/24 at 12:00 PM EDT by a video enabled telemedicine application and verified that I am speaking with the correct person using two identifiers.  Location: Patient: Home Provider: Remote office   I discussed the limitations of evaluation and management by telemedicine and the availability of in person appointments. The patient expressed understanding and agreed to proceed.   I discussed the assessment and treatment plan with the patient. The patient was provided an opportunity to ask questions and all were answered. The patient agreed with the plan and demonstrated an understanding of the instructions.   The patient was advised to call back or seek an in-person evaluation if the symptoms worsen or if the condition fails to improve as anticipated.  I provided 73 minutes of non-face-to-face time during this encounter. Molly Greer, [facility]  Session Time: 12:00 PM - 1:13 PM  Participation Level: Active  Behavioral Response: Casual, Alert, Dysphoric  Type of Therapy: Group Therapy  Interventions: CBT and Supportive  Summary: Molly Greer is a 63 y.o. female who presents with a history of anxiety, depression, and trauma. She appeared dysphoric but oriented x5. Her speech was circumstantial and she will benefit from upcoming individual session to process some things deeper. Molly Greer reported she was successful in her goal of sitting outside and she was able to read. She noted her goal for this week is to get outside again and work on Environmental consultant.    Therapist Response: Conducted telehealth group session. Began session with introductions and review of group rules and expectations. Engaged group members in mindfulness exercise - guided meditation for creating a joyful day. Conducted brief check-in with each group member. Reviewed handouts and worksheets - Emotion regulation module -  Love. Passenger transport manager with each group member and goal setting between now and next session.   Suicidal/Homicidal: No  Diagnosis: PTSD (post-traumatic stress disorder)  Major depressive disorder, recurrent episode, moderate (HCC)  Generalized anxiety disorder  Patient/Guardian was advised Release of Information must be obtained prior to any record release in order to collaborate their care with an outside provider. Patient/Guardian was advised if they have not already done so to contact the registration department to sign all necessary forms in order for us  to release information regarding their care.   Consent: Patient/Guardian gives verbal consent for treatment and assignment of benefits for services provided during this visit. Patient/Guardian expressed understanding and agreed to proceed.   Molly Greer, [facility] 01/01/2024

## 2024-01-03 ENCOUNTER — Other Ambulatory Visit: Payer: Self-pay | Admitting: Cardiology

## 2024-01-04 ENCOUNTER — Other Ambulatory Visit: Payer: Self-pay

## 2024-01-04 NOTE — Progress Notes (Signed)
 Pt called asking when next APP was.  JNL.

## 2024-01-05 ENCOUNTER — Ambulatory Visit: Admitting: Professional Counselor

## 2024-01-09 ENCOUNTER — Ambulatory Visit: Admitting: Professional Counselor

## 2024-01-09 DIAGNOSIS — F411 Generalized anxiety disorder: Secondary | ICD-10-CM

## 2024-01-09 DIAGNOSIS — F4310 Post-traumatic stress disorder, unspecified: Secondary | ICD-10-CM | POA: Diagnosis not present

## 2024-01-09 DIAGNOSIS — F331 Major depressive disorder, recurrent, moderate: Secondary | ICD-10-CM | POA: Diagnosis not present

## 2024-01-09 NOTE — Progress Notes (Unsigned)
  THERAPIST PROGRESS NOTE  Session Time: 1:00 PM - 1:45 PM   Participation Level: {BHH PARTICIPATION LEVEL:22264}  Behavioral Response: {Appearance:22683}{BHH LEVEL OF CONSCIOUSNESS:22305}{BHH MOOD:22306}  Type of Therapy: {CHL AMB BH Type of Therapy:21022741}  Treatment Goals addressed: ***  ProgressTowards Goals: {Progress Towards Goals:21014066}  Interventions: {CHL AMB BH Type of Intervention:21022753}  Summary: Molly Greer is a 63 y.o. female who presents with ***.   Suicidal/Homicidal: {BHH YES OR NO:22294}{yes/no/with/without intent/plan:22693}  Therapist Response: ***  Plan: Return again in *** weeks.  Diagnosis: PTSD (post-traumatic stress disorder)  Major depressive disorder, recurrent episode, moderate (HCC)  Generalized anxiety disorder  Collaboration of Care: {BH OP Collaboration of Care:21014065}  Patient/Guardian was advised Release of Information must be obtained prior to any record release in order to collaborate their care with an outside provider. Patient/Guardian was advised if they have not already done so to contact the registration department to sign all necessary forms in order for us  to release information regarding their care.   Consent: Patient/Guardian gives verbal consent for treatment and assignment of benefits for services provided during this visit. Patient/Guardian expressed understanding and agreed to proceed.   Molly Greer, [facility] 01/09/2024

## 2024-01-12 ENCOUNTER — Ambulatory Visit: Admitting: Professional Counselor

## 2024-01-18 ENCOUNTER — Ambulatory Visit: Admitting: Internal Medicine

## 2024-01-18 ENCOUNTER — Encounter: Payer: Self-pay | Admitting: Internal Medicine

## 2024-01-18 VITALS — BP 130/80 | HR 88 | Temp 97.9°F | Ht 62.0 in | Wt 186.4 lb

## 2024-01-18 DIAGNOSIS — E669 Obesity, unspecified: Secondary | ICD-10-CM

## 2024-01-18 DIAGNOSIS — J449 Chronic obstructive pulmonary disease, unspecified: Secondary | ICD-10-CM | POA: Diagnosis not present

## 2024-01-18 DIAGNOSIS — G4733 Obstructive sleep apnea (adult) (pediatric): Secondary | ICD-10-CM

## 2024-01-18 DIAGNOSIS — Z6834 Body mass index (BMI) 34.0-34.9, adult: Secondary | ICD-10-CM

## 2024-01-18 MED ORDER — BREZTRI AEROSPHERE 160-9-4.8 MCG/ACT IN AERO: 2.0000 | INHALATION_SPRAY | Freq: Two times a day (BID) | RESPIRATORY_TRACT | Status: AC

## 2024-01-18 NOTE — Patient Instructions (Signed)
 Will plan to restart Breztri  inhaler as this helped you the most Please continue to work on stopping smoking Continue to use albuterol  as needed We referral to the lung cancer screening program  Avoid Allergens and Irritants Avoid secondhand smoke Avoid SICK contacts Recommend  Masking  when appropriate Recommend Keep up-to-date with vaccinations
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
## 2024-01-19 ENCOUNTER — Ambulatory Visit: Admitting: Professional Counselor

## 2024-01-24 ENCOUNTER — Ambulatory Visit: Admitting: Psychiatry

## 2024-01-26 ENCOUNTER — Ambulatory Visit: Admitting: Professional Counselor

## 2024-01-26 DIAGNOSIS — F411 Generalized anxiety disorder: Secondary | ICD-10-CM | POA: Diagnosis not present

## 2024-01-26 DIAGNOSIS — F331 Major depressive disorder, recurrent, moderate: Secondary | ICD-10-CM | POA: Diagnosis not present

## 2024-01-26 DIAGNOSIS — F4310 Post-traumatic stress disorder, unspecified: Secondary | ICD-10-CM | POA: Diagnosis not present

## 2024-01-26 NOTE — Progress Notes (Signed)
  THERAPIST PROGRESS NOTE  Virtual Visit via Video Note  I connected with Molly Greer on 01/29/24 at 12:00 PM EDT by a video enabled telemedicine application and verified that I am speaking with the correct person using two identifiers.  Location: Patient: Home Provider: Remote office   I discussed the limitations of evaluation and management by telemedicine and the availability of in person appointments. The patient expressed understanding and agreed to proceed.  I discussed the assessment and treatment plan with the patient. The patient was provided an opportunity to ask questions and all were answered. The patient agreed with the plan and demonstrated an understanding of the instructions.   The patient was advised to call back or seek an in-person evaluation if the symptoms worsen or if the condition fails to improve as anticipated.  I provided 59 minutes of non-face-to-face time during this encounter. Molly Greer, [facility]  Session Time: 12:00 PM - 12:59 PM   Participation Level: Active  Behavioral Response: Well Groomed, Alert, Anxious and Dysphoric  Type of Therapy: Group Therapy  Interventions: CBT and Supportive  Summary: Molly Greer is a 63 y.o. female who presents with a history of anxiety, depression, and trauma. She appeared somber but oriented x5. She stated things have been up and down the last two or three weeks. She reported she has been trying to do mindfulness and emotion regulation though.   Therapist Response: Conducted telehealth group session. Began session with introductions and review of group rules and expectations. Engaged group members in mindfulness exercise - guided meditation for acceptance of emotions. Conducted brief check-in with each group member. Reviewed handouts and worksheets - Emotion regulation module - Opposite action/fear. Passenger transport manager and assigned homework to practice opposite action for fear.  Suicidal/Homicidal:  No  Diagnosis: PTSD (post-traumatic stress disorder)  Major depressive disorder, recurrent episode, moderate (HCC)  Generalized anxiety disorder  Patient/Guardian was advised Release of Information must be obtained prior to any record release in order to collaborate their care with an outside provider. Patient/Guardian was advised if they have not already done so to contact the registration department to sign all necessary forms in order for us  to release information regarding their care.   Consent: Patient/Guardian gives verbal consent for treatment and assignment of benefits for services provided during this visit. Patient/Guardian expressed understanding and agreed to proceed.   Molly Greer, [facility] 01/29/2024

## 2024-01-30 ENCOUNTER — Ambulatory Visit: Admitting: Professional Counselor

## 2024-02-02 ENCOUNTER — Ambulatory Visit: Admitting: Professional Counselor

## 2024-02-03 ENCOUNTER — Other Ambulatory Visit: Payer: Self-pay | Admitting: Cardiology

## 2024-02-05 ENCOUNTER — Telehealth: Payer: Self-pay

## 2024-02-05 NOTE — Telephone Encounter (Signed)
 Received fax from patients pharmacy requesting a refill of lamoTRIgine  (LAMICTAL ) 100 MG tablet   Last visit 01-24-24  Next visit 04-09-23    Preferred Pharmacies   [facility] - [place], [place] - [address] Phone: [phone]  Fax: [phone]

## 2024-02-06 ENCOUNTER — Other Ambulatory Visit: Payer: Self-pay | Admitting: Psychiatry

## 2024-02-06 DIAGNOSIS — F331 Major depressive disorder, recurrent, moderate: Secondary | ICD-10-CM

## 2024-02-06 MED ORDER — LAMOTRIGINE 100 MG PO TABS: 100.0000 mg | ORAL_TABLET | Freq: Every day | ORAL | 2 refills | Status: AC

## 2024-02-07 ENCOUNTER — Ambulatory Visit: Admitting: Psychiatry

## 2024-02-07 DIAGNOSIS — F411 Generalized anxiety disorder: Secondary | ICD-10-CM

## 2024-02-07 DIAGNOSIS — F331 Major depressive disorder, recurrent, moderate: Secondary | ICD-10-CM | POA: Diagnosis not present

## 2024-02-07 DIAGNOSIS — F4310 Post-traumatic stress disorder, unspecified: Secondary | ICD-10-CM | POA: Diagnosis not present

## 2024-02-07 MED ORDER — ATOMOXETINE HCL 25 MG PO CAPS: 25.0000 mg | ORAL_CAPSULE | Freq: Every day | ORAL | 0 refills | Status: DC

## 2024-02-07 NOTE — Progress Notes (Signed)
 BH MD/PA/NP OP Progress Note  02/07/2024 11:35 AM Molly Greer  MRN:  [id number]  Chief Complaint: Routine Follow-up HPI: 63 year old female presenting [facility] for follow-up.  Patient reports that she is doing okay but states the Wellbutrin  is not going well for her.  Patient reports that Wellbutrin  is causing sedation so patient has been recommended stop the medication.  Patient reports that she has been rehabilitating her relationship with her daughter who was released from jail and states that she is part of her program and when she is involved with state government and reports that she is proud of her and states that she is trying to develop a good relationship with her so that she can rebuild her family.  Patient also reports that she is having intimacy issues with her current partner [place] who she believes is still interested but she reports that she is missing the intimacy and hopes to find more.  Patient was intentional about giving him a hug and stated that after the hug that she had to go on because he had to go to work.  Patient reports that she is trying to search for intimacy with him but not sure if it will come back.  Patient also reports significant amount of concentration and focus issues stating she does not have enough motivation and when she is open to trying atomoxetine 25 mg once daily.  Medications been sent to the pharmacy.  Patient is in agreement treatment plan.  Patient will continue other medications other than Wellbutrin  which has been stopped.  See plan.  Patient with no other questions or concerns.  Patient denies SI, HI, AVH.  Patient to follow-up in 2 weeks for medication adjustment. Visit Diagnosis:    ICD-10-CM   1. Major depressive disorder, recurrent episode, moderate (HCC)  F33.1     2. PTSD (post-traumatic stress disorder)  F43.10     3. Generalized anxiety disorder  F41.1       Past Psychiatric History:  Previous Psych Hospitalizations:  -Denies   Outpatient  treatment:  -Denies   Medications Current: - Lamictal  100mg  once daily - Hydroxyzine  25mg  TID, for anxiety.  -Atomoxetine 25mg  once daily.    Medication Trials: -Denies   Suicide & Violence: -Denies SI, HI, AVH.  No previous suicide attempt.   Psychotherapy: -Currently in therapy with Almarie Ligas at [facility].   Legal: -Denies  Past Medical History:  Past Medical History:  Diagnosis Date   Asthma    Bone spur    right foot   Cocaine use    Depression    Generalized anxiety disorder    Hypertension    MRSA infection    Pneumonia    Right inguinal hernia 08/2023   Stroke [facility])     Past Surgical History:  Procedure Laterality Date   HERNIORRHAPHY, INGUINAL, ROBOT-ASSISTED, LAPAROSCOPIC Right 08/23/2023   Procedure: HERNIORRHAPHY, INGUINAL, ROBOT-ASSISTED, LAPAROSCOPIC;  Surgeon: Marinda Jayson KIDD, MD;  Location: [facility] ORS;  Service: General;  Laterality: Right;  with mesh   ROTATOR CUFF REPAIR Left    TUBAL LIGATION      Family Psychiatric History: No additional  Family History: No family history on file.  Social History:  Social History   Socioeconomic History   Marital status: Single    Spouse name: Not on file   Number of children: 3   Years of education: Not on file   Highest education level: Not on file  Occupational History   Not on file  Tobacco Use  Smoking status: Every Day    Current packs/day: 0.50    Types: Cigarettes    Passive exposure: Past   Smokeless tobacco: Never   Tobacco comments:    In the past 3 months she has reduced down 0.5 and had previously quit for a year during Covid years.        Exie Essex, CMA 08/01/23  Vaping Use   Vaping status: Former   Substances: Flavoring  Substance and Sexual Activity   Alcohol use: Yes    Comment: occassional   Drug use: Yes    Types: Cocaine    Comment: states not now, states no meth   Sexual activity: Not Currently  Other Topics Concern   Not on file  Social History Narrative    Lives with partner   Social Drivers of Health   Financial Resource Strain: Low Risk  (11/15/2023)   Received from [facility]   Overall Financial Resource Strain (CARDIA)    Difficulty of Paying Living Expenses: Not hard at all  Food Insecurity: No Food Insecurity (11/15/2023)   Received from [facility]   Hunger Vital Sign    Within the past 12 months, you worried that your food would run out before you got the money to buy more.: Never true    Within the past 12 months, the food you bought just didn't last and you didn't have money to get more.: Never true  Transportation Needs: No Transportation Needs (11/15/2023)   Received from [facility] - Transportation    In the past 12 months, has lack of transportation kept you from medical appointments or from getting medications?: No    Lack of Transportation (Non-Medical): No  Physical Activity: Inactive (08/02/2023)   Exercise Vital Sign    Days of Exercise per Week: 0 days    Minutes of Exercise per Session: 0 min  Stress: Stress Concern Present (08/02/2023)   [employer] - Occupational Stress Questionnaire    Feeling of Stress : Very much  Social Connections: Socially Isolated (08/02/2023)   Social Connection and Isolation Panel    Frequency of Communication with Friends and Family: Never    Frequency of Social Gatherings with Friends and Family: Never    Attends Religious Services: Never    Database Administrator or Organizations: No    Attends Banker Meetings: Never    Marital Status: Living with partner    Allergies:  Allergies  Allergen Reactions   Penicillins Swelling    Throat swelling    Metabolic Disorder Labs: Lab Results  Component Value Date   HGBA1C 5.6 10/12/2023   No results found for: PROLACTIN Lab Results  Component Value Date   CHOL 315 (H) 10/12/2023   TRIG 159 (H) 10/12/2023   HDL 54 10/12/2023    CHOLHDL 5.8 (H) 10/12/2023   VLDL 24 04/09/2011   LDLCALC 231 (H) 10/12/2023   LDLCALC 104 (H) 04/09/2011   Lab Results  Component Value Date   TSH 1.310 10/12/2023   TSH 1.110 06/16/2023    Therapeutic Level Labs: No results found for: LITHIUM No results found for: VALPROATE No results found for: CBMZ  Current Medications: Current Outpatient Medications  Medication Sig Dispense Refill   albuterol  (VENTOLIN  HFA) 108 (90 Base) MCG/ACT inhaler INHALE 2 PUFFS INTO THE LUNGS EVERY 6 HOURS AS NEEDED FOR WHEEZING OR SHORTNESS OF BREATH 18 g 1   baclofen  (LIORESAL ) 10 MG  tablet TAKE 1 TABLET BY MOUTH ONCE DAILY 30 each 0   budesonide-glycopyrrolate-formoterol (BREZTRI  AEROSPHERE) 160-9-4.8 MCG/ACT AERO inhaler Inhale 2 puffs into the lungs in the morning and at bedtime.     buPROPion  (WELLBUTRIN  XL) 150 MG 24 hr tablet Take 1 tablet (150 mg total) by mouth daily. 30 tablet 0   fluticasone -salmeterol (ADVAIR  HFA) 230-21 MCG/ACT inhaler Inhale 2 puffs into the lungs 2 (two) times daily. 360 g 3   hydrOXYzine  (ATARAX ) 25 MG tablet Take 1 tablet (25 mg total) by mouth 3 (three) times daily as needed. 90 tablet 0   lamoTRIgine  (LAMICTAL ) 100 MG tablet Take 1 tablet (100 mg total) by mouth daily. 30 tablet 2   Lidocaine -Menthol (LIDOCAINE  PLUS MENTHOL) 4-1 % PTCH Apply topically.     metoprolol  succinate (TOPROL -XL) 25 MG 24 hr tablet Take 25 mg by mouth at bedtime.     No current facility-administered medications for this visit.     Musculoskeletal: Strength & Muscle Tone: within normal limits Gait & Station: normal Patient leans: N/A   Psychiatric Specialty Exam: Review of Systems  Constitutional: Negative.   HENT: Negative.    Eyes: Negative.   Respiratory: Negative.    Cardiovascular: Negative.   Gastrointestinal: Negative.   Endocrine: Negative.   Genitourinary: Negative.   Musculoskeletal: Negative.   Skin: Negative.   Allergic/Immunologic: Negative.    Neurological: Negative.   Hematological: Negative.   Psychiatric/Behavioral:  Positive for dysphoric mood. The patient is nervous/anxious.      There were no vitals filed for this visit. There is no height or weight on file to calculate BMI.   General Appearance: Well Groomed  Eye Contact:  Good  Speech:  Clear and Coherent  Volume:  Normal  Mood:  Anxious and Depressed  Affect:  Depressed  Thought Process:  Coherent  Orientation:  Full (Time, Place, and Person)  Thought Content: WDL   Suicidal Thoughts:  No  Homicidal Thoughts:  No  Memory:  Immediate;   Good Recent;   Good Remote;   Good  Judgement:  Good  Insight:  Good  Psychomotor Activity:  Normal  Concentration:  Concentration: Good and Attention Span: Good  Recall:  Good  Fund of Knowledge: Good  Language: Good  Akathisia:  No  Handed:  Right  AIMS (if indicated):   Assets:  Desire for Improvement Financial Resources/Insurance Housing  ADL's:  Intact  Cognition: WNL  Sleep:  Good   Screenings: AUDIT    Advertising Copywriter from 08/02/2023 in [facility]  Alcohol Use Disorder Identification Test Final Score (AUDIT) 3   GAD-7    Flowsheet Row Counselor from 01/09/2024 in [facility] Office Visit from 12/27/2023 in [facility] Office Visit from 10/11/2023 in [facility] Office Visit from 09/13/2023 in [facility] Counselor from 08/02/2023 in [facility]  Total GAD-7 Score 8 11 10 6 4    PHQ2-9    Flowsheet Row Counselor from 01/09/2024 in [facility] Office Visit from 12/27/2023 in [facility] Office Visit from 10/11/2023 in [facility] Office Visit from  09/13/2023 in [facility] Office Visit from 08/15/2023 in [facility]  PHQ-2 Total Score 3 4 5 2 2   PHQ-9 Total Score 12 18 15 10  11  Flowsheet Row Office Visit from 09/13/2023 in [facility] Admission (Discharged) from 08/23/2023 in [facility] Counselor from 08/02/2023 in [facility]  C-SSRS RISK CATEGORY No Risk No Risk No Risk     Assessment and Plan:  Assessment - Diagnosis:  Severe episode of recurrent major depressive disorder, 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  - Risk Factors: Suicidal risk, worsening symptoms  Plan - Medications:  Continue Lamictal  100 mg once a day for depression.  Patient has been educated on the adverse reaction of rashes patient educated if a new rash develops to notify the clinic and stop the medication.  Patient has also been educated on the GI irritability and initial headache and dizziness and has been asked to wait and see if the symptoms are resolved.  Her last longer than a week please contact clinic Continue hydroxyzine  25 mg 3 times a day as needed for anxiety.  Patient educated that this medication is sedating and not to take out while driving or operating machinery. Stop the Wellbutrin  Xl.  Start Atomoxetine 25mg  once daily.  - Psychotherapy: Pt actively participating with Almarie Ligas for therapy.  - Education: Patient has been educated on her current stressors as well as coping strategies to help improve mood as well as her energy.  Patient instructed to walk outside for 20 minutes if  possible to get some sunshine as well as to exercise to promote energy.  Patient also educated on medications with side effects and adverse reactions. - Follow-Up: Patient will follow up in 1 month - Referrals: No referrals - Safety Planning:  The patient has been educated, if they should have suicidal thoughts with or without a plan to call 911, or go to the closest emergency department.  Pt verbalized understanding.  Pt denies firearms within the home.  Pt also agrees to call the clinic should they have worsening symptoms before the next appointment.    Patient/Guardian was advised Release of Information must be obtained prior to any record release in order to collaborate their care with an outside provider. Patient/Guardian was advised if they have not already done so to contact the registration department to sign all necessary forms in order for us  to release information regarding their care.   Consent: Patient/Guardian gives verbal consent for treatment and assignment of benefits for services provided during this visit. Patient/Guardian expressed understanding and agreed to proceed.    Dorn Jama Der, NP 02/07/2024, 11:35 AM

## 2024-02-08 ENCOUNTER — Ambulatory Visit: Admitting: Cardiology

## 2024-02-09 ENCOUNTER — Ambulatory Visit: Admitting: Professional Counselor

## 2024-02-09 DIAGNOSIS — F331 Major depressive disorder, recurrent, moderate: Secondary | ICD-10-CM

## 2024-02-09 DIAGNOSIS — F411 Generalized anxiety disorder: Secondary | ICD-10-CM

## 2024-02-09 DIAGNOSIS — F4310 Post-traumatic stress disorder, unspecified: Secondary | ICD-10-CM

## 2024-02-09 NOTE — Progress Notes (Signed)
  THERAPIST PROGRESS NOTE  Virtual Visit via Video Note  I connected with Molly Greer on 02/10/24 at 12:00 PM EST by a video enabled telemedicine application and verified that I am speaking with the correct person using two identifiers.  Location: Patient: Home Provider: Remote office   I discussed the limitations of evaluation and management by telemedicine and the availability of in person appointments. The patient expressed understanding and agreed to proceed.  I discussed the assessment and treatment plan with the patient. The patient was provided an opportunity to ask questions and all were answered. The patient agreed with the plan and demonstrated an understanding of the instructions.   The patient was advised to call back or seek an in-person evaluation if the symptoms worsen or if the condition fails to improve as anticipated.  I provided 38 minutes of non-face-to-face time during this encounter. Molly Greer, [facility]  Session Time: 12:22 PM - 1:00 PM   Participation Level: Minimal  Behavioral Response: Casual, Alert, Anxious  Type of Therapy: Group Therapy  Interventions: CBT and Supportive  Summary: Molly Greer is a 63 y.o. female who presents with a history of anxiety, depression, and trauma. She appeared somber but oriented x5. She stated she's not feeling well today and prefers to leave her camera off. [name] stated her goal for this week is to work on the relationship with her daughter who was recently released from jail.   Therapist Response: Conducted telehealth group session. Began session with introductions and review of group rules and expectations. Engaged group members in mindfulness exercise - guided meditation for emotional regulation. Conducted brief check-in with each group member. Reviewed handouts and worksheets - Emotion regulation module - Opposite action and problem solving. Passenger transport manager with each group member and goal setting between now  and next session.   Suicidal/Homicidal: No  Diagnosis: PTSD (post-traumatic stress disorder)  Generalized anxiety disorder  Major depressive disorder, recurrent episode, moderate (HCC)  Patient/Guardian was advised Release of Information must be obtained prior to any record release in order to collaborate their care with an outside provider. Patient/Guardian was advised if they have not already done so to contact the registration department to sign all necessary forms in order for us  to release information regarding their care.   Consent: Patient/Guardian gives verbal consent for treatment and assignment of benefits for services provided during this visit. Patient/Guardian expressed understanding and agreed to proceed.   Molly Greer, [facility] 02/10/2024

## 2024-02-16 ENCOUNTER — Ambulatory Visit: Admitting: Professional Counselor

## 2024-02-16 DIAGNOSIS — F411 Generalized anxiety disorder: Secondary | ICD-10-CM

## 2024-02-16 DIAGNOSIS — F331 Major depressive disorder, recurrent, moderate: Secondary | ICD-10-CM

## 2024-02-16 DIAGNOSIS — F4310 Post-traumatic stress disorder, unspecified: Secondary | ICD-10-CM

## 2024-02-17 NOTE — Progress Notes (Signed)
  THERAPIST PROGRESS NOTE  Virtual Visit via Video Note  I connected with Molly Greer on 02/17/24 at 12:00 PM EST by a video enabled telemedicine application and verified that I am speaking with the correct person using two identifiers.  Location: Patient: Home Provider: Remote office   I discussed the limitations of evaluation and management by telemedicine and the availability of in person appointments. The patient expressed understanding and agreed to proceed.   I discussed the assessment and treatment plan with the patient. The patient was provided an opportunity to ask questions and all were answered. The patient agreed with the plan and demonstrated an understanding of the instructions.   The patient was advised to call back or seek an in-person evaluation if the symptoms worsen or if the condition fails to improve as anticipated.  I provided 60 minutes of non-face-to-face time during this encounter. Molly Greer, [facility]  Session Time: 12:00 PM - 1:00 PM  Participation Level: Active  Behavioral Response: Casual, Alert, Dysphoric  Type of Therapy: Group Therapy  Interventions: CBT and Supportive  Summary: Molly Greer is a 63 y.o. female who presents with a history of anxiety, depression, and PTSD. She appeared somber but oriented x5. She stated she's doing alright. She reported she started PT. She has been trying to implement something over nothing with productivity. Molly Greer initially felt as though she didn't work on her goal, but with assistance from this clinical research associate, she agreed she did work on the relationship with her daughter. She told her daughter happy birthday and has been giving her space. Molly Greer identified her goal as working on tefl teacher and completing activities from the pleasant events list.   Therapist Response: Conducted telehealth group session. Began session with introductions and review of group rules and expectations. Engaged group members in  mindfulness exercise - guided meditation for problem solving. Conducted brief check-in with each group member. Reviewed handouts and worksheets - Emotion regulation module - ABC PLEASE skills. Passenger transport manager with each group member and goal setting between now and next session.  Suicidal/Homicidal: No  Diagnosis: PTSD (post-traumatic stress disorder)  Major depressive disorder, recurrent episode, moderate (HCC)  Generalized anxiety disorder   Patient/Guardian was advised Release of Information must be obtained prior to any record release in order to collaborate their care with an outside provider. Patient/Guardian was advised if they have not already done so to contact the registration department to sign all necessary forms in order for us  to release information regarding their care.   Consent: Patient/Guardian gives verbal consent for treatment and assignment of benefits for services provided during this visit. Patient/Guardian expressed understanding and agreed to proceed.   Molly Greer, [facility] 02/17/2024

## 2024-02-20 ENCOUNTER — Encounter: Payer: Self-pay | Admitting: Cardiology

## 2024-02-20 ENCOUNTER — Ambulatory Visit: Admitting: Cardiology

## 2024-02-20 VITALS — BP 124/82 | HR 97 | Ht 62.0 in | Wt 181.6 lb

## 2024-02-20 DIAGNOSIS — R000 Tachycardia, unspecified: Secondary | ICD-10-CM

## 2024-02-20 DIAGNOSIS — F17210 Nicotine dependence, cigarettes, uncomplicated: Secondary | ICD-10-CM

## 2024-02-20 DIAGNOSIS — Z720 Tobacco use: Secondary | ICD-10-CM

## 2024-02-20 DIAGNOSIS — Z1231 Encounter for screening mammogram for malignant neoplasm of breast: Secondary | ICD-10-CM

## 2024-02-20 DIAGNOSIS — F3341 Major depressive disorder, recurrent, in partial remission: Secondary | ICD-10-CM

## 2024-02-20 DIAGNOSIS — Z13220 Encounter for screening for lipoid disorders: Secondary | ICD-10-CM

## 2024-02-20 DIAGNOSIS — Z0130 Encounter for examination of blood pressure without abnormal findings: Secondary | ICD-10-CM

## 2024-02-20 DIAGNOSIS — Z131 Encounter for screening for diabetes mellitus: Secondary | ICD-10-CM | POA: Diagnosis not present

## 2024-02-20 DIAGNOSIS — Z1329 Encounter for screening for other suspected endocrine disorder: Secondary | ICD-10-CM

## 2024-02-20 MED ORDER — BACLOFEN 10 MG PO TABS: 10.0000 mg | ORAL_TABLET | Freq: Every day | ORAL | 0 refills | Status: AC
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
## 2024-02-21 ENCOUNTER — Ambulatory Visit (INDEPENDENT_AMBULATORY_CARE_PROVIDER_SITE_OTHER): Admitting: Psychiatry

## 2024-02-21 ENCOUNTER — Other Ambulatory Visit: Payer: Self-pay

## 2024-02-21 ENCOUNTER — Encounter: Payer: Self-pay | Admitting: Psychiatry

## 2024-02-21 VITALS — BP 152/84 | HR 98 | Temp 97.5°F | Ht 62.0 in | Wt 181.7 lb

## 2024-02-21 DIAGNOSIS — F331 Major depressive disorder, recurrent, moderate: Secondary | ICD-10-CM | POA: Diagnosis not present

## 2024-02-21 DIAGNOSIS — F411 Generalized anxiety disorder: Secondary | ICD-10-CM

## 2024-02-21 DIAGNOSIS — F4310 Post-traumatic stress disorder, unspecified: Secondary | ICD-10-CM

## 2024-02-21 LAB — CMP14+EGFR
ALT: 72 IU/L — ABNORMAL HIGH (ref 0–32)
AST: 64 IU/L — ABNORMAL HIGH (ref 0–40)
Albumin: 4.1 g/dL (ref 3.9–4.9)
Alkaline Phosphatase: 113 IU/L (ref 49–135)
BUN/Creatinine Ratio: 22 (ref 12–28)
BUN: 14 mg/dL (ref 8–27)
Bilirubin Total: 0.2 mg/dL (ref 0.0–1.2)
CO2: 24 mmol/L (ref 20–29)
Calcium: 9 mg/dL (ref 8.7–10.3)
Chloride: 103 mmol/L (ref 96–106)
Creatinine, Ser: 0.63 mg/dL (ref 0.57–1.00)
Globulin, Total: 2.5 g/dL (ref 1.5–4.5)
Glucose: 104 mg/dL — ABNORMAL HIGH (ref 70–99)
Potassium: 4 mmol/L (ref 3.5–5.2)
Sodium: 142 mmol/L (ref 134–144)
Total Protein: 6.6 g/dL (ref 6.0–8.5)
eGFR: 100 mL/min/1.73 (ref >59)

## 2024-02-21 LAB — LIPID PANEL
Chol/HDL Ratio: 7.4 ratio — ABNORMAL HIGH (ref 0.0–4.4)
Cholesterol, Total: 282 mg/dL — ABNORMAL HIGH (ref 100–199)
HDL: 38 mg/dL — ABNORMAL LOW (ref >39)
LDL Chol Calc (NIH): 195 mg/dL — ABNORMAL HIGH (ref 0–99)
Triglycerides: 252 mg/dL — ABNORMAL HIGH (ref 0–149)
VLDL Cholesterol Cal: 49 mg/dL — ABNORMAL HIGH (ref 5–40)

## 2024-02-21 LAB — HEMOGLOBIN A1C
Est. average glucose Bld gHb Est-mCnc: 114 mg/dL
Hgb A1c MFr Bld: 5.6 % (ref 4.8–5.6)

## 2024-02-21 LAB — TSH: TSH: 1.48 u[IU]/mL (ref 0.450–4.500)

## 2024-02-21 MED ORDER — ATOMOXETINE HCL 25 MG PO CAPS
25.0000 mg | ORAL_CAPSULE | Freq: Two times a day (BID) | ORAL | 0 refills | Status: AC
Start: 2024-02-21 — End: ?

## 2024-02-21 NOTE — Progress Notes (Signed)
 [facility] MD/PA/NP OP Progress Note  02/21/2024 10:37 AM Molly Greer  MRN:  [id number]  Chief Complaint:  Chief Complaint  Patient presents with   Follow-up   HPI: 63 year old female presenting [facility] for follow-up.  Patient reports that she has had some improvement with this week stating that she is still overwhelmed by all the medical responsibilities she has an appointment but states that overall she is feeling more intimacy with her partner and is stating she is feeling more in control with her life.  Patient also reports she picked up a really fun hobby in which she is collecting pennies and valuing them as well as determining what is of value or not.  Patient reports this hobby has been very therapeutic for her and she is looking forward to spending more time with her.  Patient states that she is having to get some work done on her hip so she went to find a hobby that she can do in the meantime while in recovery after she gets her surgery.  Patient reports group therapy is going well and states she is looking forward to having one-to-one sessions with Almarie Ligas.  Patient states that she is gena continue the current recommendations from group therapy as well as this provider.  Patient is to continue current medication regimen with the addition of increasing atomoxetine  to 25 mg 2 times a day.  Patient denies any cardiac issues or any side effects.  Patient is in agreement with treatment plan.  Patient with no other question concerns.  Patient is to follow-up in 2 weeks.  Patient denies SI, HI, AVH. Visit Diagnosis:    ICD-10-CM   1. PTSD (post-traumatic stress disorder)  F43.10     2. Major depressive disorder, recurrent episode, moderate (HCC)  F33.1     3. Generalized anxiety disorder  F41.1       Past Psychiatric History:  Previous Psych Hospitalizations:  -Denies   Outpatient treatment:  -Denies   Medications Current: - Lamictal  100mg  once daily - Hydroxyzine  25mg  TID, for  anxiety.  -Atomoxetine  25mg  once BID.    Medication Trials: -Denies   Suicide & Violence: -Denies SI, HI, AVH.  No previous suicide attempt.   Psychotherapy: -Currently in therapy with Almarie Ligas at [facility].   Legal: -Denies    Past Medical History:  Past Medical History:  Diagnosis Date   Asthma    Bone spur    right foot   Cocaine use    Depression    Generalized anxiety disorder    Hypertension    MRSA infection    Pneumonia    Right inguinal hernia 08/2023   Stroke [facility])     Past Surgical History:  Procedure Laterality Date   HERNIORRHAPHY, INGUINAL, ROBOT-ASSISTED, LAPAROSCOPIC Right 08/23/2023   Procedure: HERNIORRHAPHY, INGUINAL, ROBOT-ASSISTED, LAPAROSCOPIC;  Surgeon: Marinda Jayson KIDD, MD;  Location: [facility] ORS;  Service: General;  Laterality: Right;  with mesh   ROTATOR CUFF REPAIR Left    TUBAL LIGATION      Family Psychiatric History: No additional  Family History: History reviewed. No pertinent family history.  Social History:  Social History   Socioeconomic History   Marital status: Single    Spouse name: Not on file   Number of children: 3   Years of education: Not on file   Highest education level: Not on file  Occupational History   Not on file  Tobacco Use   Smoking status: Every Day    Current packs/day: 0.50  Types: Cigarettes    Passive exposure: Past   Smokeless tobacco: Never   Tobacco comments:    In the past 3 months she has reduced down 0.5 and had previously quit for a year during Covid years.        Exie Essex, CMA 08/01/23  Vaping Use   Vaping status: Former   Substances: Flavoring  Substance and Sexual Activity   Alcohol use: Yes    Comment: occassional   Drug use: Yes    Types: Cocaine    Comment: states not now, states no meth   Sexual activity: Not Currently  Other Topics Concern   Not on file  Social History Narrative   Lives with partner   Social Drivers of Health   Financial Resource Strain: Low  Risk  (11/15/2023)   Received from [facility]   Overall Financial Resource Strain (CARDIA)    Difficulty of Paying Living Expenses: Not hard at all  Food Insecurity: No Food Insecurity (11/15/2023)   Received from [facility]   Hunger Vital Sign    Within the past 12 months, you worried that your food would run out before you got the money to buy more.: Never true    Within the past 12 months, the food you bought just didn't last and you didn't have money to get more.: Never true  Transportation Needs: No Transportation Needs (11/15/2023)   Received from [facility] - Transportation    In the past 12 months, has lack of transportation kept you from medical appointments or from getting medications?: No    Lack of Transportation (Non-Medical): No  Physical Activity: Inactive (08/02/2023)   Exercise Vital Sign    Days of Exercise per Week: 0 days    Minutes of Exercise per Session: 0 min  Stress: Stress Concern Present (08/02/2023)   [employer] of Occupational Health - Occupational Stress Questionnaire    Feeling of Stress : Very much  Social Connections: Socially Isolated (08/02/2023)   Social Connection and Isolation Panel    Frequency of Communication with Friends and Family: Never    Frequency of Social Gatherings with Friends and Family: Never    Attends Religious Services: Never    Database Administrator or Organizations: No    Attends Banker Meetings: Never    Marital Status: Living with partner    Allergies:  Allergies  Allergen Reactions   Penicillins Swelling    Throat swelling    Metabolic Disorder Labs: Lab Results  Component Value Date   HGBA1C 5.6 02/20/2024   No results found for: PROLACTIN Lab Results  Component Value Date   CHOL 282 (H) 02/20/2024   TRIG 252 (H) 02/20/2024   HDL 38 (L) 02/20/2024   CHOLHDL 7.4 (H) 02/20/2024   VLDL 24 04/09/2011   LDLCALC 195 (H)  02/20/2024   LDLCALC 231 (H) 10/12/2023   Lab Results  Component Value Date   TSH 1.480 02/20/2024   TSH 1.310 10/12/2023    Therapeutic Level Labs: No results found for: LITHIUM No results found for: VALPROATE No results found for: CBMZ  Current Medications: Current Outpatient Medications  Medication Sig Dispense Refill   albuterol  (VENTOLIN  HFA) 108 (90 Base) MCG/ACT inhaler INHALE 2 PUFFS INTO THE LUNGS EVERY 6 HOURS AS NEEDED FOR WHEEZING OR SHORTNESS OF BREATH 18 g 1   atomoxetine (STRATTERA) 25 MG capsule Take 1 capsule (25 mg total) by mouth daily. 30 capsule  0   baclofen  (LIORESAL ) 10 MG tablet Take 1 tablet (10 mg total) by mouth daily. 90 tablet 0   budesonide-glycopyrrolate-formoterol (BREZTRI  AEROSPHERE) 160-9-4.8 MCG/ACT AERO inhaler Inhale 2 puffs into the lungs in the morning and at bedtime.     fluticasone -salmeterol (ADVAIR  HFA) 230-21 MCG/ACT inhaler Inhale 2 puffs into the lungs 2 (two) times daily. 360 g 3   hydrOXYzine  (ATARAX ) 25 MG tablet Take 1 tablet (25 mg total) by mouth 3 (three) times daily as needed. 90 tablet 0   lamoTRIgine  (LAMICTAL ) 100 MG tablet Take 1 tablet (100 mg total) by mouth daily. 30 tablet 2   No current facility-administered medications for this visit.     Musculoskeletal: Strength & Muscle Tone: within normal limits Gait & Station: normal Patient leans: N/A   Psychiatric Specialty Exam: Review of Systems  Constitutional: Negative.   HENT: Negative.    Eyes: Negative.   Respiratory: Negative.    Cardiovascular: Negative.   Gastrointestinal: Negative.   Endocrine: Negative.   Genitourinary: Negative.   Musculoskeletal: Negative.   Skin: Negative.   Allergic/Immunologic: Negative.   Neurological: Negative.   Hematological: Negative.   Psychiatric/Behavioral:  Positive for dysphoric mood. The patient is nervous/anxious.      Today's Vitals   02/21/24 1013  BP: (!) 152/84  Pulse: 98  Temp: (!) 97.5 F (36.4 C)   TempSrc: Temporal  Weight: 181 lb 10.5 oz (82.4 kg)  Height: 5' 2 (1.575 m)   Body mass index is 33.23 kg/m.     General Appearance: Well Groomed  Eye Contact:  Good  Speech:  Clear and Coherent  Volume:  Normal  Mood:  Anxious and Depressed  Affect:  Depressed  Thought Process:  Coherent  Orientation:  Full (Time, Place, and Person)  Thought Content: WDL   Suicidal Thoughts:  No  Homicidal Thoughts:  No  Memory:  Immediate;   Good Recent;   Good Remote;   Good  Judgement:  Good  Insight:  Good  Psychomotor Activity:  Normal  Concentration:  Concentration: Good and Attention Span: Good  Recall:  Good  Fund of Knowledge: Good  Language: Good  Akathisia:  No  Handed:  Right  AIMS (if indicated):   Assets:  Desire for Improvement Financial Resources/Insurance Housing  ADL's:  Intact  Cognition: WNL  Sleep:  Good   Screenings: AUDIT    Advertising Copywriter from 08/02/2023 in [facility]  Alcohol Use Disorder Identification Test Final Score (AUDIT) 3   GAD-7    Flowsheet Row Counselor from 01/09/2024 in [facility] Office Visit from 12/27/2023 in [facility] Office Visit from 10/11/2023 in [facility] Office Visit from 09/13/2023 in [facility] Counselor from 08/02/2023 in [facility]  Total GAD-7 Score 8 11 10 6 4    PHQ2-9    Flowsheet Row Counselor from 01/09/2024 in [facility] Office Visit from 12/27/2023 in [facility] Office Visit from 10/11/2023 in [facility] Office Visit from 09/13/2023 in [facility] Office Visit from 08/15/2023 in [facility]  PHQ-2 Total Score 3 4 5 2 2   PHQ-9 Total Score 12 18 15 10 11    Flowsheet Row Office Visit from 09/13/2023 in [facility] Admission (Discharged) from  08/23/2023 in [facility] Counselor from 08/02/2023 in [facility]  C-SSRS RISK CATEGORY No Risk No Risk No Risk     Assessment and Plan:  Assessment - Diagnosis:  Severe episode of recurrent major depressive disorder, 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  - Risk Factors: Suicidal risk, worsening symptoms  Plan - Medications:  Continue Lamictal  100 mg once a day for depression.  Patient has been educated on the adverse reaction of rashes patient educated if a new rash develops to notify the clinic and stop the medication.  Patient has also been educated on the GI irritability and initial headache and dizziness and has been asked to wait and see if the symptoms are resolved.  Her last longer than a week please contact clinic Continue hydroxyzine  25 mg 3 times a day as needed for anxiety.  Patient educated that this medication is sedating and not to take out while driving or operating machinery. Increase Atomoxetine 25mg  BID.  - Psychotherapy: Pt actively participating with Almarie Ligas for therapy.  - Education: Patient has been educated on her current stressors as well as coping strategies to help improve mood as well as her energy.  Patient instructed to walk outside for 20 minutes if possible to get some sunshine as well as to exercise to promote energy.  Patient also educated on medications with side effects and adverse reactions. -  Follow-Up: Patient will follow up in 1 month - Referrals: No referrals - Safety Planning:  The patient has been educated, if they should have suicidal thoughts with or without a plan to call 911, or go to the closest emergency department.  Pt verbalized understanding.  Pt denies firearms within the home.  Pt also agrees to call the clinic should they have worsening symptoms before the next appointment.   Patient/Guardian was advised Release of Information must be obtained prior to any record release in order to collaborate their care with an outside provider. Patient/Guardian was advised if they have not already done so to contact the registration department to sign all necessary forms in order for us  to release information regarding their care.   Consent: Patient/Guardian gives verbal consent for treatment and assignment of benefits for services provided during this visit. Patient/Guardian expressed understanding and agreed to proceed.    Dorn Jama Der, NP 02/21/2024, 10:37 AM

## 2024-02-22 ENCOUNTER — Other Ambulatory Visit: Payer: Self-pay | Admitting: Cardiology

## 2024-02-22 ENCOUNTER — Ambulatory Visit: Payer: Self-pay | Admitting: Cardiology

## 2024-02-22 ENCOUNTER — Ambulatory Visit (INDEPENDENT_AMBULATORY_CARE_PROVIDER_SITE_OTHER): Admitting: Professional Counselor

## 2024-02-22 DIAGNOSIS — F4310 Post-traumatic stress disorder, unspecified: Secondary | ICD-10-CM | POA: Diagnosis not present

## 2024-02-22 MED ORDER — PRAVASTATIN SODIUM 10 MG PO TABS: 10.0000 mg | ORAL_TABLET | Freq: Every day | ORAL | 1 refills | Status: AC

## 2024-02-22 NOTE — Progress Notes (Signed)
Pt informed

## 2024-02-22 NOTE — Progress Notes (Signed)
  THERAPIST PROGRESS NOTE  Session Time: 11:04 AM - 11:57 AM  Participation Level: Active  Behavioral Response: Casual, Alert, Dysphoric  Type of Therapy: Individual Therapy  Treatment Goals addressed: Active OP Depression  LTG: Being able to use coping skills.  (Progressing)                Start:  08/25/23    Expected End:  08/23/24      STG: Continuing group and learning more skills. To reduce sxs AEB engagement in therapy and practicing skills outside of session 3 out of 7 days a week for the next 12 weeks.  (Progressing)  Goal Note Reviewed 01/09/24 - Has been practicing skills learned in session.    STG: I want to do more. To increase socialization AEB behavior activation and engagement in weekly recreational activities for the next 12 weeks.  (Progressing)  Goal Note Reviewed 01/09/24 - I make sure I get up out of bed, get the shades up, clean, making my bed, still taking trash out, just trying to manage the necessities, still struggling with socialization.  ProgressTowards Goals: Progressing  Interventions: Motivational Interviewing and Supportive  Summary: Molly Greer is a 63 y.o. female who presents with a history of anxiety, depression, and PTSD. She appeared alert and oriented x5. She engaged in discussion, processing family dynamics and events with her daughters. She also engaged in discussion about her relationship. She noted improvements in their communication. Molly Greer also shared about a hobby she recently started again, coin collecting/selling. She noted how it has improved her mood and motivation.   Therapist Response: Conducted session with Molly Greer. Began session with check-in/update since previous session. Utilized empathetic and reflective listening. Used open-ended questions to facilitate discussion and summarized Molly Greer's thoughts/feelings. Validated her feelings in response to various events. Provided positive reinforcement with hobby. Scheduled additional  appointment and concluded session.   Suicidal/Homicidal: No  Plan: Return again in 2 weeks.  Diagnosis: PTSD (post-traumatic stress disorder)  Collaboration of Care: Medication Management AEB chart review  Patient/Guardian was advised Release of Information must be obtained prior to any record release in order to collaborate their care with an outside provider. Patient/Guardian was advised if they have not already done so to contact the registration department to sign all necessary forms in order for us  to release information regarding their care.   Consent: Patient/Guardian gives verbal consent for treatment and assignment of benefits for services provided during this visit. Patient/Guardian expressed understanding and agreed to proceed.   Molly Greer, [facility] 02/22/2024

## 2024-02-23 ENCOUNTER — Ambulatory Visit (INDEPENDENT_AMBULATORY_CARE_PROVIDER_SITE_OTHER): Admitting: Professional Counselor

## 2024-02-23 DIAGNOSIS — F4310 Post-traumatic stress disorder, unspecified: Secondary | ICD-10-CM | POA: Diagnosis not present

## 2024-02-23 NOTE — Progress Notes (Unsigned)
  THERAPIST PROGRESS NOTE  Virtual Visit via Video Note  I connected with Molly Greer on 02/26/24 at 12:00 PM EST by a video enabled telemedicine application and verified that I am speaking with the correct person using two identifiers.  Location: Patient: Home Provider: Remote office   I discussed the limitations of evaluation and management by telemedicine and the availability of in person appointments. The patient expressed understanding and agreed to proceed.   I discussed the assessment and treatment plan with the patient. The patient was provided an opportunity to ask questions and all were answered. The patient agreed with the plan and demonstrated an understanding of the instructions.   The patient was advised to call back or seek an in-person evaluation if the symptoms worsen or if the condition fails to improve as anticipated.  I provided 44 minutes of non-face-to-face time during this encounter. [name], [facility]  Session Time: 12:16 PM - 1:00 PM   Participation Level: Active  Behavioral Response: Casual, Alert, Anxious  Type of Therapy: Group Therapy  Interventions: CBT and Supportive  Summary: Molly Greer is a 63 y.o. female who presents with a history of anxiety, depression, and PTSD. She appeared flustered but oriented x5. She reported she was having trouble logging in. [name] shared about her recent engagement in PT and how she's trying to take better care of herself.   Therapist Response: Conducted telehealth group session. Began session with introductions and review of group rules and expectations. Engaged group members in mindfulness exercise - guided meditation for self-care. Conducted brief check-in with each group member. Reviewed handouts and worksheets - Emotion regulation module - Mindfulness of current emotions, managing extreme emotions, and troubleshoot/review. Passenger transport manager and assigned homework to practice emotion regulation skills using  worksheets at end of module.  Suicidal/Homicidal: No  Diagnosis: PTSD (post-traumatic stress disorder)   Patient/Guardian was advised Release of Information must be obtained prior to any record release in order to collaborate their care with an outside provider. Patient/Guardian was advised if they have not already done so to contact the registration department to sign all necessary forms in order for us  to release information regarding their care.   Consent: Patient/Guardian gives verbal consent for treatment and assignment of benefits for services provided during this visit. Patient/Guardian expressed understanding and agreed to proceed.   [name], [facility] 02/23/2024

## 2024-03-08 ENCOUNTER — Ambulatory Visit: Admitting: Professional Counselor

## 2024-03-13 ENCOUNTER — Ambulatory Visit: Admitting: Psychiatry

## 2024-03-15 ENCOUNTER — Ambulatory Visit: Admitting: Professional Counselor

## 2024-03-15 DIAGNOSIS — F4310 Post-traumatic stress disorder, unspecified: Secondary | ICD-10-CM

## 2024-03-15 NOTE — Progress Notes (Unsigned)
°  THERAPIST PROGRESS NOTE  Virtual Visit via Video Note  I connected with Molly Greer on 03/15/2024 at 12:00 PM EST by a video enabled telemedicine application and verified that I am speaking with the correct person using two identifiers.  Location: Patient: Home Provider: Remote office   I discussed the limitations of evaluation and management by telemedicine and the availability of in person appointments. The patient expressed understanding and agreed to proceed.   I discussed the assessment and treatment plan with the patient. The patient was provided an opportunity to ask questions and all were answered. The patient agreed with the plan and demonstrated an understanding of the instructions.   The patient was advised to call back or seek an in-person evaluation if the symptoms worsen or if the condition fails to improve as anticipated.  I provided 58 minutes of non-face-to-face time during this encounter. Molly Greer, [facility]  Session Time: 12:00 PM - 12:58 PM   Participation Level: Active  Behavioral Response: Casual, Alert, Dysphoric  Type of Therapy: Group Therapy  Interventions: CBT and Supportive  Summary: Molly Greer is a 63 y.o. female who presents with a history of anxiety, depression, and PTSD. She appeared somber but oriented x5. She stated December is hard for her. She noted an issue with her mail and was receptive to calling the post master to discuss this concern. She reported Thanksgiving was hard without family.   Therapist Response: Conducted telehealth group session. Began session with introductions and review of group rules and expectations. Engaged group members in mindfulness exercise - guided meditation for body scan. Conducted brief check-in with each group member. Reviewed handouts and worksheets - Distress tolerance module - Goals and crisis skills. Passenger transport manager  and assigned homework to complete pros/cons of using crisis  skills.  Suicidal/Homicidal: No  Diagnosis: PTSD (post-traumatic stress disorder)  Patient/Guardian was advised Release of Information must be obtained prior to any record release in order to collaborate their care with an outside provider. Patient/Guardian was advised if they have not already done so to contact the registration department to sign all necessary forms in order for us  to release information regarding their care.   Consent: Patient/Guardian gives verbal consent for treatment and assignment of benefits for services provided during this visit. Patient/Guardian expressed understanding and agreed to proceed.   Molly Greer, [facility] 03/15/2024

## 2024-03-20 ENCOUNTER — Telehealth: Payer: Self-pay

## 2024-03-20 ENCOUNTER — Ambulatory Visit: Admitting: Psychiatry

## 2024-03-20 ENCOUNTER — Other Ambulatory Visit: Payer: Self-pay | Admitting: Psychiatry

## 2024-03-20 ENCOUNTER — Other Ambulatory Visit: Payer: Self-pay

## 2024-03-20 ENCOUNTER — Encounter: Payer: Self-pay | Admitting: Psychiatry

## 2024-03-20 VITALS — BP 150/89 | HR 99 | Temp 97.4°F | Ht 62.0 in | Wt 178.4 lb

## 2024-03-20 DIAGNOSIS — F331 Major depressive disorder, recurrent, moderate: Secondary | ICD-10-CM

## 2024-03-20 DIAGNOSIS — F4310 Post-traumatic stress disorder, unspecified: Secondary | ICD-10-CM | POA: Diagnosis not present

## 2024-03-20 DIAGNOSIS — F411 Generalized anxiety disorder: Secondary | ICD-10-CM

## 2024-03-20 MED ORDER — ATOMOXETINE HCL 25 MG PO CAPS: ORAL_CAPSULE | ORAL | 0 refills | Status: AC

## 2024-03-20 NOTE — Progress Notes (Signed)
 BH MD/PA/NP OP Progress Note  03/20/2024 11:38 AM Molly Greer  MRN:  [id number]  Chief Complaint:  Chief Complaint  Patient presents with   Follow-up   HPI: 63 year old female presenting [facility] for follow-up.  Patient reports that she is doing well and keeping up with all of her medical appointments as well as her therapy and psychiatric appointments.  Patient feels she is often feeling overwhelmed that she is getting ready for Christmas but states that she is utilizing her skills she is learning with her current provider Almarie in which understanding her limitations and how to keep herself motivated to be able to continue in her day-to-day tasks.  Patient reports she is getting a lot from a tomoxetine stating that is helping her clean the house as well as getting interest in things like pending collecting.  Patient reports that she does not have any SI, HI, AVH.  Patient with no other question concerns at this time.  Patient is in agreement with treatment plan.  Based on this assessment interview patient will continue current medication regimen in which we are increasing atomoxetine  to 50 mg once daily in the morning and 25 mg once in afternoon.  Patient will be monitored based on the medication adjustment.  Patient has decided to follow with this provider on the next practice.  Patient with no follow-up at this time. Visit Diagnosis:    ICD-10-CM   1. PTSD (post-traumatic stress disorder)  F43.10     2. Major depressive disorder, recurrent episode, moderate (HCC)  F33.1     3. Generalized anxiety disorder  F41.1       Past Psychiatric History:  Previous Psych Hospitalizations:  -Denies   Outpatient treatment:  -Denies   Medications Current: - Lamictal  100mg  once daily - Hydroxyzine  25mg  TID, for anxiety.  -Atomoxetine  50mg  once daily in the morning and 25 mg once daily in the afternoon.    Medication Trials: -Denies   Suicide & Violence: -Denies SI, HI, AVH.  No previous  suicide attempt.   Psychotherapy: -Currently in therapy with Almarie Ligas at [facility].   Legal: -Denies  Past Medical History:  Past Medical History:  Diagnosis Date   Asthma    Bone spur    right foot   Cocaine use    Depression    Generalized anxiety disorder    Hypertension    MRSA infection    Pneumonia    Right inguinal hernia 08/2023   Stroke [facility])     Past Surgical History:  Procedure Laterality Date   HERNIORRHAPHY, INGUINAL, ROBOT-ASSISTED, LAPAROSCOPIC Right 08/23/2023   Procedure: HERNIORRHAPHY, INGUINAL, ROBOT-ASSISTED, LAPAROSCOPIC;  Surgeon: Marinda Jayson KIDD, MD;  Location: [facility] ORS;  Service: General;  Laterality: Right;  with mesh   ROTATOR CUFF REPAIR Left    TUBAL LIGATION      Family Psychiatric History: No additional  Family History: History reviewed. No pertinent family history.  Social History:  Social History   Socioeconomic History   Marital status: Single    Spouse name: Not on file   Number of children: 3   Years of education: Not on file   Highest education level: Associate degree: academic program  Occupational History   Not on file  Tobacco Use   Smoking status: Every Day    Current packs/day: 0.50    Types: Cigarettes    Passive exposure: Past   Smokeless tobacco: Never   Tobacco comments:    In the past 3 months she has reduced down  0.5 and had previously quit for a year during Covid years.        Exie Essex, CMA 08/01/23  Vaping Use   Vaping status: Former   Substances: Flavoring  Substance and Sexual Activity   Alcohol use: Yes    Comment: occassional   Drug use: Yes    Types: Cocaine    Comment: states not now, states no meth   Sexual activity: Not Currently  Other Topics Concern   Not on file  Social History Narrative   Lives with partner   Social Drivers of Health   Tobacco Use: High Risk (03/20/2024)   Patient History    Smoking Tobacco Use: Every Day    Smokeless Tobacco Use: Never    Passive Exposure:  Past  Financial Resource Strain: Low Risk  (11/15/2023)   Received from [facility]   Overall Financial Resource Strain (CARDIA)    Difficulty of Paying Living Expenses: Not hard at all  Food Insecurity: No Food Insecurity (11/15/2023)   Received from [facility]   Epic    Within the past 12 months, you worried that your food would run out before you got the money to buy more.: Never true    Within the past 12 months, the food you bought just didn't last and you didn't have money to get more.: Never true  Transportation Needs: No Transportation Needs (11/15/2023)   Received from [facility] - Transportation    In the past 12 months, has lack of transportation kept you from medical appointments or from getting medications?: No    Lack of Transportation (Non-Medical): No  Physical Activity: Inactive (08/02/2023)   Exercise Vital Sign    Days of Exercise per Week: 0 days    Minutes of Exercise per Session: 0 min  Stress: Stress Concern Present (08/02/2023)   [employer] of [employer]ational [employer] - Occupational Stress Questionnaire    Feeling of Stress : Very much  Social Connections: Socially Isolated (08/02/2023)   Social Connection and Isolation Panel    Frequency of Communication with Friends and Family: Never    Frequency of Social Gatherings with Friends and Family: Never    Attends Religious Services: Never    Database Administrator or Organizations: No    Attends Banker Meetings: Never    Marital Status: Living with partner  Depression (PHQ2-9): High Risk (01/09/2024)   Depression (PHQ2-9)    PHQ-2 Score: 12  Alcohol Screen: Low Risk (08/02/2023)   Alcohol Screen    Last Alcohol Screening Score (AUDIT): 3  Housing: Low Risk  (11/15/2023)   Received from [facility]   Epic    In the last 12 months, was there a time when you were not able to pay the mortgage or rent on time?: No    In  the past 12 months, how many times have you moved where you were living?: 0    At any time in the past 12 months, were you homeless or living in a shelter (including now)?: No  Utilities: Not At Risk (11/15/2023)   Received from [facility]   Epic    In the past 12 months has the electric, gas, oil, or water company threatened to shut off services in your home?: No  Health Literacy: Adequate Health Literacy (08/02/2023)   B1300 Health Literacy    Frequency of need for help with medical instructions: Never  Allergies: Allergies[1]  Metabolic Disorder Labs: Lab Results  Component Value Date   HGBA1C 5.6 02/20/2024   No results found for: PROLACTIN Lab Results  Component Value Date   CHOL 282 (H) 02/20/2024   TRIG 252 (H) 02/20/2024   HDL 38 (L) 02/20/2024   CHOLHDL 7.4 (H) 02/20/2024   VLDL 24 04/09/2011   LDLCALC 195 (H) 02/20/2024   LDLCALC 231 (H) 10/12/2023   Lab Results  Component Value Date   TSH 1.480 02/20/2024   TSH 1.310 10/12/2023    Therapeutic Level Labs: No results found for: LITHIUM No results found for: VALPROATE No results found for: CBMZ  Current Medications: Current Outpatient Medications  Medication Sig Dispense Refill   albuterol  (VENTOLIN  HFA) 108 (90 Base) MCG/ACT inhaler INHALE 2 PUFFS INTO THE LUNGS EVERY 6 HOURS AS NEEDED FOR WHEEZING OR SHORTNESS OF BREATH 18 g 1   atomoxetine  (STRATTERA ) 25 MG capsule Take 1 capsule (25 mg total) by mouth 2 (two) times daily with a meal. 60 capsule 0   baclofen  (LIORESAL ) 10 MG tablet Take 1 tablet (10 mg total) by mouth daily. 90 tablet 0   budesonide-glycopyrrolate-formoterol (BREZTRI  AEROSPHERE) 160-9-4.8 MCG/ACT AERO inhaler Inhale 2 puffs into the lungs in the morning and at bedtime.     fluticasone -salmeterol (ADVAIR  HFA) 230-21 MCG/ACT inhaler Inhale 2 puffs into the lungs 2 (two) times daily. 360 g 3   hydrOXYzine  (ATARAX ) 25 MG tablet Take 1 tablet (25 mg total) by mouth 3  (three) times daily as needed. 90 tablet 0   lamoTRIgine  (LAMICTAL ) 100 MG tablet Take 1 tablet (100 mg total) by mouth daily. 30 tablet 2   pravastatin  (PRAVACHOL ) 10 MG tablet Take 1 tablet (10 mg total) by mouth daily. 90 tablet 1   No current facility-administered medications for this visit.     Musculoskeletal: Strength & Muscle Tone: within normal limits Gait & Station: normal Patient leans: N/A   Psychiatric Specialty Exam: Review of Systems  Constitutional: Negative.   HENT: Negative.    Eyes: Negative.   Respiratory: Negative.    Cardiovascular: Negative.   Gastrointestinal: Negative.   Endocrine: Negative.   Genitourinary: Negative.   Musculoskeletal: Negative.   Skin: Negative.   Allergic/Immunologic: Negative.   Neurological: Negative.   Hematological: Negative.   Psychiatric/Behavioral:  Positive for dysphoric mood. The patient is nervous/anxious.      Today's Vitals   03/20/24 1102  BP: (!) 150/89  Pulse: 99  Temp: (!) 97.4 F (36.3 C)  TempSrc: Temporal  Weight: 178 lb 6.4 oz (80.9 kg)  Height: 5' 2 (1.575 m)   Body mass index is 32.63 kg/m.     General Appearance: Well Groomed  Eye Contact:  Good  Speech:  Clear and Coherent  Volume:  Normal  Mood:  Anxious and Depressed  Affect:  Depressed  Thought Process:  Coherent  Orientation:  Full (Time, Place, and Person)  Thought Content: WDL   Suicidal Thoughts:  No  Homicidal Thoughts:  No  Memory:  Immediate;   Good Recent;   Good Remote;   Good  Judgement:  Good  Insight:  Good  Psychomotor Activity:  Normal  Concentration:  Concentration: Good and Attention Span: Good  Recall:  Good  Fund of Knowledge: Good  Language: Good  Akathisia:  No  Handed:  Right  AIMS (if indicated):   Assets:  Desire for Improvement Financial Resources/Insurance Housing  ADL's:  Intact  Cognition: WNL  Sleep:  Good   Screenings: AUDIT  Flowsheet Row Counselor from 08/02/2023 in [facility]  Alcohol Use Disorder Identification Test Final Score (AUDIT) 3   GAD-7    Flowsheet Row Counselor from 01/09/2024 in [facility] Office Visit from 12/27/2023 in [facility] Office Visit from 10[facility]11/2023 in [facility] Office Visit from 09/13/2023 in [facility] Counselor from 08/02/2023 in [facility]  Total GAD-7 Score 8 11 10 6 4    PHQ2-9    Flowsheet Row Counselor from 01/09/2024 in [facility] Office Visit from 12/27/2023 in [facility] Office Visit from 10/11/2023 in [facility] Office Visit from 09/13/2023 in [facility] Office Visit from 08/15/2023 in [facility]  PHQ-2 Total Score 3 4 5 2 2   PHQ-9 Total Score 12 18 15 10 11    Flowsheet Row Office Visit from 09/13/2023 in [facility] Admission (Discharged) from 08/23/2023 in [facility] Counselor from 08/02/2023 in [facility]  C-SSRS RISK CATEGORY No Risk No Risk No Risk     Assessment and Plan:  Assessment - Diagnosis:  Severe episode of recurrent major depressive disorder, 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  - Risk Factors: Suicidal risk, worsening symptoms  Plan - Medications:  Continue Lamictal  100 mg once a day for depression.   Patient has been educated on the adverse reaction of rashes patient educated if a new rash develops to notify the clinic and stop the medication.  Patient has also been educated on the GI irritability and initial headache and dizziness and has been asked to wait and see if the symptoms are resolved.  Her last longer than a week please contact clinic Continue hydroxyzine  25 mg 3 times a day as needed for anxiety.  Patient educated that this medication is sedating and not to take out while driving or operating machinery. Increase Atomoxetine  to 50mg  once daily in the morning and 25mg  in the afternoon.   - Psychotherapy: Pt actively participating with Almarie Ligas for therapy.  - Education: Patient has been educated on her current stressors as well as coping strategies to help improve mood as well as her energy.  Patient instructed to walk outside for 20 minutes if possible to get some sunshine as well as to exercise to promote energy.  Patient also educated on medications with side effects and adverse reactions. - Follow-Up: Patient will follow up in 1 month - Referrals: No referrals - Safety Planning:  The patient has been educated, if they should have suicidal thoughts with or without a plan to call 911, or go to the closest emergency department.  Pt verbalized understanding.  Pt denies firearms within the home.  Pt also agrees to call the clinic should they have worsening symptoms before the next appointment.    Patient/Guardian was advised Release of Information must be obtained prior to any record release in order to collaborate their care with an outside provider. Patient/Guardian was advised if they have not already done so to contact the registration department to  sign all necessary forms in order for us  to release information regarding their care.   Consent: Patient/Guardian gives verbal consent for treatment and assignment of benefits for services provided during this visit. Patient/Guardian  expressed understanding and agreed to proceed.    Dorn Jama Der, NP 03/20/2024, 11:38 AM     [1]  Allergies Allergen Reactions   Penicillins Swelling    Throat swelling

## 2024-03-20 NOTE — Telephone Encounter (Signed)
 Pt's pharmacy [facility] left a voicemail stating they would like clarification from provider on medication that was sent atomoxetine  (STRATTERA ) 25 MG capsule  [facility] - [place], [place] - [address] [phone]

## 2024-03-21 ENCOUNTER — Ambulatory Visit: Admitting: Professional Counselor

## 2024-03-21 DIAGNOSIS — F411 Generalized anxiety disorder: Secondary | ICD-10-CM | POA: Diagnosis not present

## 2024-03-21 DIAGNOSIS — F4310 Post-traumatic stress disorder, unspecified: Secondary | ICD-10-CM | POA: Diagnosis not present

## 2024-03-21 DIAGNOSIS — F331 Major depressive disorder, recurrent, moderate: Secondary | ICD-10-CM

## 2024-03-21 NOTE — Progress Notes (Unsigned)
°  THERAPIST PROGRESS NOTE  Session Time: 11:15 AM -   Participation Level: Active  Behavioral Response: Casual, Alert, Dysphoric  Type of Therapy: Individual Therapy  Treatment Goals addressed: Active OP Depression  LTG: Being able to use coping skills.  (Progressing)                Start:  08/25/23    Expected End:  08/23/24      STG: Continuing group and learning more skills. To reduce sxs AEB engagement in therapy and practicing skills outside of session 3 out of 7 days a week for the next 12 weeks.  (Progressing)  Goal Note Reviewed 01/09/24 - Has been practicing skills learned in session.    STG: I want to do more. To increase socialization AEB behavior activation and engagement in weekly recreational activities for the next 12 weeks.  (Progressing)  Goal Note Reviewed 01/09/24 - I make sure I get up out of bed, get the shades up, clean, making my bed, still taking trash out, just trying to manage the necessities, still struggling with socialization.  ProgressTowards Goals: Progressing  Interventions: CBT, Motivational Interviewing, and Supportive  Summary: Deanndra Kirley is a 63 y.o. female who presents with a history of anxiety, depression, and PTSD. SABRA   Therapist Response: Conducted session with . Began session with check-in/update since previous session. Utilized empathetic and reflective listening. Used open-ended questions to facilitate discussion and summarized thoughts/feelings. Scheduled additional appointment and concluded session.   Suicidal/Homicidal: No  Plan: Return again in 4 weeks.  Diagnosis: PTSD (post-traumatic stress disorder)  Major depressive disorder, recurrent episode, moderate (HCC)  Generalized anxiety disorder  Collaboration of Care: Medication Management AEB chart review  Patient/Guardian was advised Release of Information must be obtained prior to any record release in order to collaborate their care with an outside provider.  Patient/Guardian was advised if they have not already done so to contact the registration department to sign all necessary forms in order for us  to release information regarding their care.   Consent: Patient/Guardian gives verbal consent for treatment and assignment of benefits for services provided during this visit. Patient/Guardian expressed understanding and agreed to proceed.   [name], [facility] 03/21/2024

## 2024-03-22 ENCOUNTER — Ambulatory Visit: Admitting: Professional Counselor

## 2024-03-22 DIAGNOSIS — F4310 Post-traumatic stress disorder, unspecified: Secondary | ICD-10-CM

## 2024-03-22 NOTE — Progress Notes (Unsigned)
" °  THERAPIST PROGRESS NOTE  Virtual Visit via Video Note  I connected with Molly Greer on 03/22/2024 at 12:00 PM EST by a video enabled telemedicine application and verified that I am speaking with the correct person using two identifiers.  Location: Patient: Home Provider: Remote office   I discussed the limitations of evaluation and management by telemedicine and the availability of in person appointments. The patient expressed understanding and agreed to proceed.   I discussed the assessment and treatment plan with the patient. The patient was provided an opportunity to ask questions and all were answered. The patient agreed with the plan and demonstrated an understanding of the instructions.   The patient was advised to call back or seek an in-person evaluation if the symptoms worsen or if the condition fails to improve as anticipated.  I provided 60 minutes of non-face-to-face time during this encounter. Molly Greer, [facility]  Session Time: 12:00 PM - 1:00 PM   Participation Level: Active  Behavioral Response: Casual, Alert, Anxious and Dysphoric  Type of Therapy: Group Therapy  Interventions: CBT and Supportive  Summary: Molly Greer is a 63 y.o. female who presents with a history of PTSD. She appeared alert and oriented x5. She stated she is really trying to work on using the skills and practicing them. She noted some situations she struggled with but highlighted one where she handled things well.   Therapist Response: Conducted telehealth group session. Began session with introductions and review of group rules and expectations. Engaged group members in mindfulness exercise - guided meditation for progressive muscle relaxation. Conducted brief check-in with each group member. Reviewed handouts and worksheets - Distress tolerance module - Effective rethinking and paired muscle relaxation, and ACCEPTS. Passenger transport manager and assigned homework to practice skills from  today's session.  Suicidal/Homicidal: No  Diagnosis: PTSD (post-traumatic stress disorder)  Patient/Guardian was advised Release of Information must be obtained prior to any record release in order to collaborate their care with an outside provider. Patient/Guardian was advised if they have not already done so to contact the registration department to sign all necessary forms in order for us  to release information regarding their care.   Consent: Patient/Guardian gives verbal consent for treatment and assignment of benefits for services provided during this visit. Patient/Guardian expressed understanding and agreed to proceed.   Molly Greer, [facility] 03/22/2024  "

## 2024-03-29 ENCOUNTER — Ambulatory Visit: Admitting: Professional Counselor

## 2024-03-29 DIAGNOSIS — F4310 Post-traumatic stress disorder, unspecified: Secondary | ICD-10-CM

## 2024-03-29 NOTE — Progress Notes (Signed)
" °  THERAPIST PROGRESS NOTE  Virtual Visit via Video Note  I connected with Molly Greer on 03/29/2024 at 12:00 PM EST by a video enabled telemedicine application and verified that I am speaking with the correct person using two identifiers.  Location: Patient: Home Provider: Remote office   I discussed the limitations of evaluation and management by telemedicine and the availability of in person appointments. The patient expressed understanding and agreed to proceed.  I discussed the assessment and treatment plan with the patient. The patient was provided an opportunity to ask questions and all were answered. The patient agreed with the plan and demonstrated an understanding of the instructions.   The patient was advised to call back or seek an in-person evaluation if the symptoms worsen or if the condition fails to improve as anticipated.  I provided 50 minutes of non-face-to-face time during this encounter. Molly Greer, [facility]  Session Time: 12:00 PM - 12:50 PM  Participation Level: Active  Behavioral Response: Casual, Alert, Dysphoric  Type of Therapy: Group Therapy  Interventions: CBT and Supportive  Summary: Molly Greer is a 63 y.o. female who presents with a history of anxiety, depression, and PTSD. She appeared somber but oriented x5. She stated it's been, a little rough. She shared updates about the holidays with some of her daughters and grandchildren. [name] noted her goal for this week is to get room organized.   Therapist Response: Conducted telehealth group session. Began session with introductions and review of group rules and expectations. Engaged group members in mindfulness exercise - guided meditation for grounding tree. Conducted brief check-in with each group member. Reviewed handouts and worksheets - Self-soothing and IMPROVE. Passenger transport manager with each group member and goal setting between now and next session.   Suicidal/Homicidal:  No  Diagnosis: PTSD (post-traumatic stress disorder)  Patient/Guardian was advised Release of Information must be obtained prior to any record release in order to collaborate their care with an outside provider. Patient/Guardian was advised if they have not already done so to contact the registration department to sign all necessary forms in order for us  to release information regarding their care.   Consent: Patient/Guardian gives verbal consent for treatment and assignment of benefits for services provided during this visit. Patient/Guardian expressed understanding and agreed to proceed.   Molly Greer, [facility] 03/29/2024  "

## 2024-04-05 ENCOUNTER — Ambulatory Visit (INDEPENDENT_AMBULATORY_CARE_PROVIDER_SITE_OTHER): Admitting: Professional Counselor

## 2024-04-05 DIAGNOSIS — F4310 Post-traumatic stress disorder, unspecified: Secondary | ICD-10-CM

## 2024-04-05 NOTE — Progress Notes (Signed)
" °  THERAPIST PROGRESS NOTE  Virtual Visit via Video Note  I connected with Molly Greer on 04/05/2024 at 12:00 PM EST by a video enabled telemedicine application and verified that I am speaking with the correct person using two identifiers.  Location: Patient: Home Provider: Remote office   I discussed the limitations of evaluation and management by telemedicine and the availability of in person appointments. The patient expressed understanding and agreed to proceed.   I discussed the assessment and treatment plan with the patient. The patient was provided an opportunity to ask questions and all were answered. The patient agreed with the plan and demonstrated an understanding of the instructions.   The patient was advised to call back or seek an in-person evaluation if the symptoms worsen or if the condition fails to improve as anticipated.  I provided 60 minutes of non-face-to-face time during this encounter. [name], [facility]  Session Time: 12:00 PM - 1:00 PM   Participation Level: Active  Behavioral Response: Casual, Alert, Dysphoric  Type of Therapy: Group Therapy  Interventions: CBT and Supportive  Summary: Ilena Dieckman is a 64 y.o. female who presents with a history of PTSD. She appeared somber but oriented x5. She stated she's glad the holidays are over. [name] noted she has been working on breaking the pattern of needing things to be perfect and rather focus on just getting things done.   Therapist Response: Conducted telehealth group session. Began session with introductions and review of group rules and expectations. Engaged group members in mindfulness exercise - guided meditation - imagery exercise. Conducted brief check-in with each group member. Reviewed handouts and worksheets - Radical acceptance. Passenger transport manager  and assigned homework to practice step-by-step radical acceptance.   Suicidal/Homicidal: No  Diagnosis: PTSD (post-traumatic stress  disorder)  Patient/Guardian was advised Release of Information must be obtained prior to any record release in order to collaborate their care with an outside provider. Patient/Guardian was advised if they have not already done so to contact the registration department to sign all necessary forms in order for us  to release information regarding their care.   Consent: Patient/Guardian gives verbal consent for treatment and assignment of benefits for services provided during this visit. Patient/Guardian expressed understanding and agreed to proceed.   [name], [facility] 04/05/2024  "

## 2024-04-12 ENCOUNTER — Ambulatory Visit: Admitting: Professional Counselor

## 2024-04-17 ENCOUNTER — Ambulatory Visit: Admitting: Professional Counselor

## 2024-04-19 ENCOUNTER — Ambulatory Visit: Admitting: Professional Counselor

## 2024-04-19 DIAGNOSIS — F4310 Post-traumatic stress disorder, unspecified: Secondary | ICD-10-CM | POA: Diagnosis not present

## 2024-04-19 NOTE — Progress Notes (Unsigned)
" °  THERAPIST PROGRESS NOTE  Virtual Visit via Video Note  I connected with Molly Greer on 04/22/24 at 12:00 PM EST by a video enabled telemedicine application and verified that I am speaking with the correct person using two identifiers.  Location: Patient: Home Provider: Remote office   I discussed the limitations of evaluation and management by telemedicine and the availability of in person appointments. The patient expressed understanding and agreed to proceed.   I discussed the assessment and treatment plan with the patient. The patient was provided an opportunity to ask questions and all were answered. The patient agreed with the plan and demonstrated an understanding of the instructions.   The patient was advised to call back or seek an in-person evaluation if the symptoms worsen or if the condition fails to improve as anticipated.  I provided 60 minutes of non-face-to-face time during this encounter. Molly Greer, [facility]  Session Time: 12:00 PM - 1:00 PM   Participation Level: Active  Behavioral Response: Well Groomed, Alert, Dysphoric  Type of Therapy: Group Therapy  Interventions: CBT and Supportive  Summary: Molly Greer is a 64 y.o. female who presents with a history of anxiety, depression, and PTSD. She appeared somber but oriented x5. She stated she had a tough week. She reported she had a cousin pass away. She reported she lit a candle for her just as she suggested to a group member in a previous session. She reported her daughter went to court. The grandchildren were allowed to see each other over the holidays. Molly Greer noted her goal for this week is to put my thoughts on sticky notes and release them.  Therapist Response: Conducted telehealth group session. Began session with introductions and review of group rules and expectations. Engaged group members in mindfulness exercise - guided meditation for willingness. Conducted brief check-in with each group  member. Reviewed handouts and worksheets - Distress tolerance module - Mindfulness of current thoughts. Passenger transport manager with each group member and goal setting between now and next session.   Suicidal/Homicidal: No  Diagnosis: PTSD (post-traumatic stress disorder)  Patient/Guardian was advised Release of Information must be obtained prior to any record release in order to collaborate their care with an outside provider. Patient/Guardian was advised if they have not already done so to contact the registration department to sign all necessary forms in order for us  to release information regarding their care.   Consent: Patient/Guardian gives verbal consent for treatment and assignment of benefits for services provided during this visit. Patient/Guardian expressed understanding and agreed to proceed.   Molly Greer, [facility] 04/22/2024  "

## 2024-04-26 ENCOUNTER — Ambulatory Visit: Admitting: Professional Counselor

## 2024-05-03 ENCOUNTER — Ambulatory Visit: Admitting: Professional Counselor

## 2024-05-03 DIAGNOSIS — F331 Major depressive disorder, recurrent, moderate: Secondary | ICD-10-CM

## 2024-05-03 DIAGNOSIS — F411 Generalized anxiety disorder: Secondary | ICD-10-CM

## 2024-05-03 DIAGNOSIS — F4310 Post-traumatic stress disorder, unspecified: Secondary | ICD-10-CM

## 2024-05-03 NOTE — Progress Notes (Unsigned)
" °  THERAPIST PROGRESS NOTE  Virtual Visit via Video Note  I connected with [name] on 05/04/24 at 12:00 PM EST by a video enabled telemedicine application and verified that I am speaking with the correct person using two identifiers.  Location: Patient: Home Provider: Remote office   I discussed the limitations of evaluation and management by telemedicine and the availability of in person appointments. The patient expressed understanding and agreed to proceed.   I discussed the assessment and treatment plan with the patient. The patient was provided an opportunity to ask questions and all were answered. The patient agreed with the plan and demonstrated an understanding of the instructions.   The patient was advised to call back or seek an in-person evaluation if the symptoms worsen or if the condition fails to improve as anticipated.  I provided 45 minutes of non-face-to-face time during this encounter. [name], [facility]  Session Time: 12:00 PM - 12:45 PM   Participation Level: Active  Behavioral Response: Well Groomed, Alert, Dysphoric  Type of Therapy: Group Therapy  Interventions: CBT and Supportive  Summary: Zynia Wojtowicz is a 64 y.o. female who presents with a history f depression, anxiety, and PTSD. She appeared alert and oriented x5. She stated she is still having issues with her mail but they said it would resume on Monday. [name] noted she purchased a printer/scanner/fax machine so she can receive items that way. She shared struggles with her partner and anxiety around upcoming winter weather. [name] was happy to share about continued engagement in coin collecting hobby.   Therapist Response: Conducted telehealth group session. Began session with introductions and review of group rules and expectations. Engaged group members in mindfulness exercise - guided meditation for urge surfing. Conducted brief check-in with each group member. Reviewed handouts and worksheets  - .Distress tolerance module - When crisis is addiction Product Manager. Passenger transport manager with each group member and goal setting between now and next session.   Suicidal/Homicidal: No  Diagnosis: PTSD (post-traumatic stress disorder)  Major depressive disorder, recurrent episode, moderate (HCC)  Generalized anxiety disorder  Patient/Guardian was advised Release of Information must be obtained prior to any record release in order to collaborate their care with an outside provider. Patient/Guardian was advised if they have not already done so to contact the registration department to sign all necessary forms in order for us  to release information regarding their care.   Consent: Patient/Guardian gives verbal consent for treatment and assignment of benefits for services provided during this visit. Patient/Guardian expressed understanding and agreed to proceed.   [name], [facility] 05/04/2024  "

## 2024-05-10 ENCOUNTER — Ambulatory Visit: Admitting: Professional Counselor

## 2024-05-10 NOTE — Progress Notes (Unsigned)
" °  THERAPIST PROGRESS NOTE  Virtual Visit via Video Note  I connected with Molly Greer on 05/10/24 at 12:00 PM EST by a video enabled telemedicine application and verified that I am speaking with the correct person using two identifiers.  Location: Patient: *** Provider: ***   I discussed the limitations of evaluation and management by telemedicine and the availability of in person appointments. The patient expressed understanding and agreed to proceed.  History of Present Illness:    Observations/Objective:   Assessment and Plan:   Follow Up Instructions:    I discussed the assessment and treatment plan with the patient. The patient was provided an opportunity to ask questions and all were answered. The patient agreed with the plan and demonstrated an understanding of the instructions.   The patient was advised to call back or seek an in-person evaluation if the symptoms worsen or if the condition fails to improve as anticipated.  I provided *** minutes of non-face-to-face time during this encounter.   Molly Greer, [facility]  Session Time: ***  Participation Level: {BHH PARTICIPATION LEVEL:22264}  Behavioral Response: {Appearance:22683}{BHH LEVEL OF CONSCIOUSNESS:22305}{BHH MOOD:22306}  Type of Therapy: {CHL AMB BH Type of Therapy:21022741}  Treatment Goals addressed: ***  ProgressTowards Goals: {Progress Towards Goals:21014066}  Interventions: {CHL AMB BH Type of Intervention:21022753}  Summary: Molly Greer is a 64 y.o. female who presents with ***.   Suicidal/Homicidal: {BHH YES OR NO:22294}{yes/no/with/without intent/plan:22693}  Therapist Response: ***  Plan: Return again in *** weeks.  Diagnosis: No diagnosis found.  Collaboration of Care: {BH OP Collaboration of Care:21014065}  Patient/Guardian was advised Release of Information must be obtained prior to any record release in order to collaborate their care with an outside provider.  Patient/Guardian was advised if they have not already done so to contact the registration department to sign all necessary forms in order for us  to release information regarding their care.   Consent: Patient/Guardian gives verbal consent for treatment and assignment of benefits for services provided during this visit. Patient/Guardian expressed understanding and agreed to proceed.   Molly Greer, [facility] 05/10/2024  "

## 2024-05-17 ENCOUNTER — Ambulatory Visit: Admitting: Professional Counselor

## 2024-05-24 ENCOUNTER — Ambulatory Visit: Admitting: Professional Counselor

## 2024-05-31 ENCOUNTER — Ambulatory Visit: Admitting: Professional Counselor

## 2024-06-18 ENCOUNTER — Encounter: Admitting: Family
